# Patient Record
Sex: Female | Born: 1945 | Race: Black or African American | Hispanic: No | Smoking: Former smoker
Health system: Southern US, Community
[De-identification: ages and names within clinical notes are randomized; demographics above are authoritative.]

## PROBLEM LIST (undated history)

## (undated) DIAGNOSIS — I10 Essential (primary) hypertension: Secondary | ICD-10-CM

## (undated) DIAGNOSIS — M1990 Unspecified osteoarthritis, unspecified site: Secondary | ICD-10-CM

## (undated) DIAGNOSIS — K635 Polyp of colon: Secondary | ICD-10-CM

## (undated) DIAGNOSIS — N183 Chronic kidney disease, stage 3 unspecified: Secondary | ICD-10-CM

## (undated) DIAGNOSIS — J309 Allergic rhinitis, unspecified: Secondary | ICD-10-CM

## (undated) DIAGNOSIS — J449 Chronic obstructive pulmonary disease, unspecified: Secondary | ICD-10-CM

## (undated) DIAGNOSIS — I5030 Unspecified diastolic (congestive) heart failure: Secondary | ICD-10-CM

## (undated) DIAGNOSIS — I480 Paroxysmal atrial fibrillation: Secondary | ICD-10-CM

## (undated) DIAGNOSIS — J8410 Pulmonary fibrosis, unspecified: Secondary | ICD-10-CM

## (undated) DIAGNOSIS — I4891 Unspecified atrial fibrillation: Secondary | ICD-10-CM

## (undated) DIAGNOSIS — N189 Chronic kidney disease, unspecified: Secondary | ICD-10-CM

## (undated) HISTORY — DX: Paroxysmal atrial fibrillation: I48.0

## (undated) HISTORY — PX: BREAST CYST EXCISION: SHX579

## (undated) HISTORY — DX: Polyp of colon: K63.5

## (undated) HISTORY — DX: Essential (primary) hypertension: I10

## (undated) HISTORY — DX: Chronic kidney disease, stage 3 unspecified: N18.30

## (undated) HISTORY — DX: Unspecified diastolic (congestive) heart failure: I50.30

## (undated) HISTORY — DX: Allergic rhinitis, unspecified: J30.9

## (undated) HISTORY — PX: VAGINAL HYSTERECTOMY: SUR661

## (undated) HISTORY — DX: Chronic obstructive pulmonary disease, unspecified: J44.9

## (undated) HISTORY — DX: Unspecified atrial fibrillation: I48.91

## (undated) HISTORY — DX: Chronic kidney disease, stage 3 (moderate): N18.3

## (undated) HISTORY — DX: Unspecified osteoarthritis, unspecified site: M19.90

## (undated) HISTORY — PX: CATARACT EXTRACTION, BILATERAL: SHX1313

## (undated) HISTORY — DX: Pulmonary fibrosis, unspecified: J84.10

## (undated) HISTORY — DX: Chronic kidney disease, unspecified: N18.9

---

## 1963-03-23 HISTORY — PX: BREAST EXCISIONAL BIOPSY: SUR124

## 2004-08-03 ENCOUNTER — Ambulatory Visit: Payer: Self-pay | Admitting: Family Medicine

## 2004-12-06 ENCOUNTER — Other Ambulatory Visit: Payer: Self-pay

## 2004-12-06 ENCOUNTER — Emergency Department: Payer: Self-pay | Admitting: Emergency Medicine

## 2005-10-13 ENCOUNTER — Ambulatory Visit: Payer: Self-pay

## 2007-05-02 ENCOUNTER — Ambulatory Visit: Payer: Self-pay

## 2008-02-20 ENCOUNTER — Ambulatory Visit: Payer: Self-pay | Admitting: Nephrology

## 2008-06-17 ENCOUNTER — Ambulatory Visit: Payer: Self-pay | Admitting: Gastroenterology

## 2010-02-04 ENCOUNTER — Ambulatory Visit: Payer: Self-pay | Admitting: Internal Medicine

## 2011-05-25 ENCOUNTER — Ambulatory Visit: Payer: Self-pay | Admitting: Internal Medicine

## 2012-01-31 ENCOUNTER — Emergency Department: Payer: Self-pay | Admitting: Emergency Medicine

## 2012-01-31 LAB — CBC
MCH: 26.4 pg (ref 26.0–34.0)
MCV: 79 fL — ABNORMAL LOW (ref 80–100)
Platelet: 217 10*3/uL (ref 150–440)
RBC: 5.04 10*6/uL (ref 3.80–5.20)
RDW: 15.3 % — ABNORMAL HIGH (ref 11.5–14.5)
WBC: 7.8 10*3/uL (ref 3.6–11.0)

## 2012-01-31 LAB — COMPREHENSIVE METABOLIC PANEL
Albumin: 3.6 g/dL (ref 3.4–5.0)
Anion Gap: 8 (ref 7–16)
Bilirubin,Total: 0.8 mg/dL (ref 0.2–1.0)
Chloride: 107 mmol/L (ref 98–107)
Co2: 27 mmol/L (ref 21–32)
Creatinine: 1.36 mg/dL — ABNORMAL HIGH (ref 0.60–1.30)
EGFR (African American): 47 — ABNORMAL LOW
EGFR (Non-African Amer.): 40 — ABNORMAL LOW
Glucose: 93 mg/dL (ref 65–99)
Osmolality: 287 (ref 275–301)
Potassium: 3.4 mmol/L — ABNORMAL LOW (ref 3.5–5.1)
SGPT (ALT): 18 U/L (ref 12–78)
Sodium: 142 mmol/L (ref 136–145)

## 2012-01-31 LAB — CK TOTAL AND CKMB (NOT AT ARMC): CK, Total: 91 U/L (ref 21–215)

## 2012-01-31 LAB — TROPONIN I: Troponin-I: 0.04 ng/mL

## 2012-01-31 LAB — MAGNESIUM: Magnesium: 1.9 mg/dL

## 2012-01-31 LAB — PROTIME-INR: Prothrombin Time: 12.8 secs (ref 11.5–14.7)

## 2012-02-07 ENCOUNTER — Encounter: Payer: Self-pay | Admitting: *Deleted

## 2012-02-10 ENCOUNTER — Ambulatory Visit (INDEPENDENT_AMBULATORY_CARE_PROVIDER_SITE_OTHER): Payer: Medicare Other | Admitting: Cardiovascular Disease

## 2012-02-10 ENCOUNTER — Encounter: Payer: Self-pay | Admitting: Cardiovascular Disease

## 2012-02-10 VITALS — BP 122/80 | HR 56 | Ht 69.0 in | Wt 262.8 lb

## 2012-02-10 DIAGNOSIS — I4891 Unspecified atrial fibrillation: Secondary | ICD-10-CM

## 2012-02-10 DIAGNOSIS — I10 Essential (primary) hypertension: Secondary | ICD-10-CM | POA: Insufficient documentation

## 2012-02-10 DIAGNOSIS — R000 Tachycardia, unspecified: Secondary | ICD-10-CM

## 2012-02-10 DIAGNOSIS — I480 Paroxysmal atrial fibrillation: Secondary | ICD-10-CM | POA: Insufficient documentation

## 2012-02-10 NOTE — Assessment & Plan Note (Signed)
The patient had a first episode of atrial fibrillation recently. It was in the setting of sinusitis with use of decongestants which likely was the trigger. The patient has no other cardiac symptoms. She has no symptoms suggestive of sleep apnea which might occasionally trigger atrial fibrillation. I recommend checking thyroid function if that has not been done in the last few months. She has been stable on current dose of Toprol with no further palpitations. I will keep her on the same medication. I asked her to avoid decongestants. I discussed with her the natural history of atrial fibrillation. I will obtain an echocardiogram to evaluate LV systolic function, atrial size and valvular structure. CHADS VAS score is 3. thus, she is at moderate risk for thromboembolic complications. However, this was her first episode and it was likely triggered by decongestants. Thus, for now I recommend continuing aspirin daily. If the patient develops recurrent episodes, anticoagulation is recommended. I will have her followup with me in 6 months to reevaluate her symptoms. I asked her to notify us if she develops recurrent palpitations or tachycardia.

## 2012-02-10 NOTE — Assessment & Plan Note (Signed)
Her blood pressure is well controlled. 

## 2012-02-10 NOTE — Progress Notes (Signed)
HPI  This is a pleasant 66 year old African American female who was referred by Dr. Lawerance Bach for evaluation and management of newly diagnosed atrial fibrillation. The patient has history of hypertension and previous tobacco use. Recently, she had sinus infection. She took significant amount of decongestants over-the-counter. She was ultimately treated with Levaquin. During her last visit with Dr. Lawerance Bach on November 11, she was noted to be tachycardic. ECG showed atrial fibrillation with rapid ventricular response. The patient had no previous history of this. She continued to be tachycardic and thus she was sent to the emergency room at [facility]. Her labs there was unremarkable except for mild hypokalemia with a potassium of 3.4. Creatinine was 1.36 which is close to baseline. There were about to give her Cardizem but she converted spontaneously to normal sinus rhythm. She was placed on Toprol 25 mg once daily. No palpitations since then. She denies any chest pain or dyspnea. She denies dizziness, syncope or presyncope. She has no history of stroke, heart failure or diabetes. She does not have history of hyperthyroidism or sleep apnea.  Allergies  Allergen Reactions  . Penicillins      Current Outpatient Prescriptions on File Prior to Visit  Medication Sig Dispense Refill  . cetirizine (ZYRTEC) 10 MG tablet Take 10 mg by mouth daily.      . chlorpheniramine-HYDROcodone (TUSSIONEX) 10-8 MG/5ML LQCR Take 5 mLs by mouth as needed.      Marland Kitchen lisinopril (PRINIVIL,ZESTRIL) 20 MG tablet Take 20 mg by mouth daily.      . metoprolol succinate (TOPROL-XL) 25 MG 24 hr tablet Take 25 mg by mouth daily.      Marland Kitchen triamterene-hydrochlorothiazide (MAXZIDE) 75-50 MG per tablet Take 1 tablet by mouth daily.         Past Medical History  Diagnosis Date  . Hypertension   . Allergic rhinitis   . COPD (chronic obstructive pulmonary disease)   . A-fib   . CKD (chronic kidney disease)   . Osteoarthritis     right knee    . Colon polyps   . Pulmonary fibrosis      Past Surgical History  Procedure Date  . Vaginal hysterectomy   . Breast cyst excision   . Cataract extraction, bilateral      History reviewed. No pertinent family history.   History   Social History  . Marital Status: Divorced    Spouse Name: N/A    Number of Children: N/A  . Years of Education: N/A   Occupational History  . Not on file.   Social History Main Topics  . Smoking status: Former Smoker -- 1.0 packs/day for 30 years    Types: Cigarettes  . Smokeless tobacco: Not on file  . Alcohol Use: No  . Drug Use: No  . Sexually Active:    Other Topics Concern  . Not on file   Social History Narrative  . No narrative on file     ROS Constitutional: Negative for fever, chills, diaphoresis, activity change, appetite change and fatigue.  HENT: Negative for hearing loss, nosebleeds, congestion, sore throat, facial swelling, drooling, trouble swallowing, neck pain, voice change, sinus pressure and tinnitus.  Eyes: Negative for photophobia, pain, discharge and visual disturbance.  Respiratory: Negative for apnea, cough, chest tightness, shortness of breath and wheezing.  Cardiovascular: Negative for chest pain, palpitations and leg swelling.  Gastrointestinal: Negative for nausea, vomiting, abdominal pain, diarrhea, constipation, blood in stool and abdominal distention.  Genitourinary: Negative for dysuria, urgency, frequency,  hematuria and decreased urine volume.  Musculoskeletal: Negative for myalgias, back pain, joint swelling, arthralgias and gait problem.  Skin: Negative for color change, pallor, rash and wound.  Neurological: Negative for dizziness, tremors, seizures, syncope, speech difficulty, weakness, light-headedness, numbness and headaches.  Psychiatric/Behavioral: Negative for suicidal ideas, hallucinations, behavioral problems and agitation. The patient is not nervous/anxious.     PHYSICAL EXAM   BP  122/80  Pulse 56  Ht 5\' 9"  (1.753 m)  Wt 262 lb 12 oz (119.183 kg)  BMI 38.80 kg/m2 Constitutional: She is oriented to person, place, and time. She appears well-developed and well-nourished. No distress.  HENT: No nasal discharge.  Head: Normocephalic and atraumatic.  Eyes: Pupils are equal and round. Right eye exhibits no discharge. Left eye exhibits no discharge.  Neck: Normal range of motion. Neck supple. No JVD present. No thyromegaly present.  Cardiovascular: Normal rate, regular rhythm, normal heart sounds. Exam reveals no gallop and no friction rub. No murmur heard.  Pulmonary/Chest: Effort normal and breath sounds normal. No stridor. No respiratory distress. She has no wheezes. She has no rales. She exhibits no tenderness.  Abdominal: Soft. Bowel sounds are normal. She exhibits no distension. There is no tenderness. There is no rebound and no guarding.  Musculoskeletal: Normal range of motion. She exhibits no edema and no tenderness.  Neurological: She is alert and oriented to person, place, and time. Coordination normal.  Skin: Skin is warm and dry. No rash noted. She is not diaphoretic. No erythema. No pallor.  Psychiatric: She has a normal mood and affect. Her behavior is normal. Judgment and thought content normal.     EKG: Sinus  Bradycardia  -RSR(V1) -nondiagnostic.   PROBABLY NORMAL   ASSESSMENT AND PLAN

## 2012-02-10 NOTE — Patient Instructions (Addendum)
Your physician has requested that you have an echocardiogram. Echocardiography is a painless test that uses sound waves to create images of your heart. It provides your doctor with information about the size and shape of your heart and how well your heart's chambers and valves are working. This procedure takes approximately one hour. There are no restrictions for this procedure.  Follow up in 6 months.  

## 2012-03-14 ENCOUNTER — Other Ambulatory Visit (INDEPENDENT_AMBULATORY_CARE_PROVIDER_SITE_OTHER): Payer: Medicare Other

## 2012-03-14 ENCOUNTER — Other Ambulatory Visit: Payer: Self-pay

## 2012-03-14 DIAGNOSIS — I4891 Unspecified atrial fibrillation: Secondary | ICD-10-CM

## 2012-09-18 ENCOUNTER — Encounter: Payer: Self-pay | Admitting: Cardiovascular Disease

## 2012-09-18 ENCOUNTER — Ambulatory Visit (INDEPENDENT_AMBULATORY_CARE_PROVIDER_SITE_OTHER): Payer: Medicare Other | Admitting: Cardiovascular Disease

## 2012-09-18 VITALS — BP 130/60 | HR 62 | Ht 69.0 in | Wt 276.5 lb

## 2012-09-18 DIAGNOSIS — I480 Paroxysmal atrial fibrillation: Secondary | ICD-10-CM

## 2012-09-18 DIAGNOSIS — R0602 Shortness of breath: Secondary | ICD-10-CM

## 2012-09-18 DIAGNOSIS — I4891 Unspecified atrial fibrillation: Secondary | ICD-10-CM

## 2012-09-18 NOTE — Progress Notes (Signed)
HPI  This is a pleasant 67 year old [place] American female who is here today for a followup visit regarding atrial fibrillation. The patient has history of hypertension and previous tobacco use. In 01/2012,  she had sinus infection. She took significant amount of decongestants over-the-counter.  During her followup visit with Dr. Lawerance Bach on November 11, she was noted to be tachycardic. ECG showed atrial fibrillation with rapid ventricular response. She continued to be tachycardic and thus she was sent to the emergency room at [facility]. Her labs there was unremarkable except for mild hypokalemia with a potassium of 3.4. Creatinine was 1.36 which is close to baseline. There were about to give her Cardizem but she converted spontaneously to normal sinus rhythm. She was placed on Toprol 25 mg once daily. No palpitations since then. She denies any chest pain or dyspnea. She denies dizziness, syncope or presyncope. She has no history of stroke, heart failure or diabetes. She does not have history of hyperthyroidism or sleep apnea. She had an echocardiogram done in our office which showed normal LV systolic function, mild mitral regurgitation, normal pulmonary pressure and normal atrial size. She has been doing well with no recurrent cardiac symptoms.  Allergies  Allergen Reactions  . Penicillins      Current Outpatient Prescriptions on File Prior to Visit  Medication Sig Dispense Refill  . Acetaminophen (TYLENOL ARTHRITIS PAIN PO) Take by mouth as needed.      Marland Kitchen aspirin 81 MG tablet Take 81 mg by mouth daily.      . cetirizine (ZYRTEC) 10 MG tablet Take 10 mg by mouth daily.      . chlorpheniramine-HYDROcodone (TUSSIONEX) 10-8 MG/5ML LQCR Take 5 mLs by mouth as needed.      . metoprolol succinate (TOPROL-XL) 25 MG 24 hr tablet Take 25 mg by mouth daily.      Marland Kitchen triamterene-hydrochlorothiazide (MAXZIDE) 75-50 MG per tablet Take 1 tablet by mouth daily.       No current facility-administered medications  on file prior to visit.     Past Medical History  Diagnosis Date  . Allergic rhinitis   . COPD (chronic obstructive pulmonary disease)   . A-fib   . CKD (chronic kidney disease)   . Osteoarthritis     right knee  . Colon polyps   . Pulmonary fibrosis   . Hypertension      Past Surgical History  Procedure Laterality Date  . Vaginal hysterectomy    . Breast cyst excision    . Cataract extraction, bilateral       History reviewed. No pertinent family history.   History   Social History  . Marital Status: Divorced    Spouse Name: N/A    Number of Children: N/A  . Years of Education: N/A   Occupational History  . Not on file.   Social History Main Topics  . Smoking status: Former Smoker -- 1.00 packs/day for 30 years    Types: Cigarettes  . Smokeless tobacco: Not on file  . Alcohol Use: No  . Drug Use: No  . Sexually Active:    Other Topics Concern  . Not on file   Social History Narrative  . No narrative on file      PHYSICAL EXAM   BP 130/60  Pulse 62  Ht 5\' 9"  (1.753 m)  Wt 276 lb 8 oz (125.42 kg)  BMI 40.81 kg/m2 Constitutional: She is oriented to person, place, and time. She appears well-developed and well-nourished. No distress.  HENT: No nasal discharge.  Head: Normocephalic and atraumatic.  Eyes: Pupils are equal and round. Right eye exhibits no discharge. Left eye exhibits no discharge.  Neck: Normal range of motion. Neck supple. No JVD present. No thyromegaly present.  Cardiovascular: Normal rate, regular rhythm, normal heart sounds. Exam reveals no gallop and no friction rub. No murmur heard.  Pulmonary/Chest: Effort normal and breath sounds normal. No stridor. No respiratory distress. She has no wheezes. She has no rales. She exhibits no tenderness.  Abdominal: Soft. Bowel sounds are normal. She exhibits no distension. There is no tenderness. There is no rebound and no guarding.  Musculoskeletal: Normal range of motion. She exhibits no  edema and no tenderness.  Neurological: She is alert and oriented to person, place, and time. Coordination normal.  Skin: Skin is warm and dry. No rash noted. She is not diaphoretic. No erythema. No pallor.  Psychiatric: She has a normal mood and affect. Her behavior is normal. Judgment and thought content normal.     AVW:[id number]  Rhythm with sinus arrhythmia WITHIN NORMAL LIMITS    ASSESSMENT AND PLAN

## 2012-09-18 NOTE — Patient Instructions (Addendum)
Continue same medications  Follow up in 1 year

## 2012-09-18 NOTE — Assessment & Plan Note (Signed)
The patient had one episode of atrial fibrillation last year in the setting of sinusitis with use of decongestants which likely was the trigger. No recurrent episodes of symptoms since then.  CHADS VAS score is 3. thus, she is at moderate risk for thromboembolic complications. However, the episode of atrial fibrillation was likely triggered by decongestants. Thus, for now I recommend continuing aspirin daily. If the patient develops recurrent episodes, anticoagulation is recommended. Her echocardiogram was overall reassuring with normal atrial size and no evidence of significant structural abnormalities. Continue small dose metoprolol. I will have her followup with me on a yearly basis but I asked her to notify us if he develops any palpitations or other cardiac symptoms.

## 2012-10-10 ENCOUNTER — Ambulatory Visit: Payer: Self-pay | Admitting: Internal Medicine

## 2013-11-05 ENCOUNTER — Encounter: Payer: Self-pay | Admitting: Cardiovascular Disease

## 2013-11-05 ENCOUNTER — Ambulatory Visit (INDEPENDENT_AMBULATORY_CARE_PROVIDER_SITE_OTHER): Payer: Medicare Other | Admitting: Cardiovascular Disease

## 2013-11-05 VITALS — BP 118/78 | HR 56 | Ht 69.0 in | Wt 273.1 lb

## 2013-11-05 DIAGNOSIS — I10 Essential (primary) hypertension: Secondary | ICD-10-CM

## 2013-11-05 DIAGNOSIS — I480 Paroxysmal atrial fibrillation: Secondary | ICD-10-CM

## 2013-11-05 DIAGNOSIS — I4891 Unspecified atrial fibrillation: Secondary | ICD-10-CM

## 2013-11-05 NOTE — Assessment & Plan Note (Signed)
Blood pressure is well controlled on current medications. 

## 2013-11-05 NOTE — Progress Notes (Signed)
HPI  This is a pleasant 68 year old [place] American female who is here today for a followup visit regarding atrial fibrillation. The patient has history of hypertension and previous tobacco use. In 01/2012,  she had sinus infection. She took significant amount of decongestants over-the-counter. She was noted to be tachycardic. ECG showed atrial fibrillation with rapid ventricular response. She continued to be tachycardic and thus she was sent to the emergency room at [facility]. Her labs there was unremarkable except for mild hypokalemia with a potassium of 3.4. Creatinine was 1.36 which is close to baseline. There were about to give her Cardizem but she converted spontaneously to normal sinus rhythm. She was placed on Toprol 25 mg once daily. No palpitations since then. She denies any chest pain or dyspnea. She denies dizziness, syncope or presyncope. She has no history of stroke, heart failure or diabetes. She does not have history of hyperthyroidism or sleep apnea. She had an echocardiogram done in our office which showed normal LV systolic function, mild mitral regurgitation, normal pulmonary pressure and normal atrial size. She has been doing well with no recurrent cardiac symptoms.  Allergies  Allergen Reactions  . Penicillins   . Statins     Other reaction(s): Other (See Comments) myalgias     Current Outpatient Prescriptions on File Prior to Visit  Medication Sig Dispense Refill  . Acetaminophen (TYLENOL ARTHRITIS PAIN PO) Take by mouth as needed.      Marland Kitchen aspirin 81 MG tablet Take 81 mg by mouth daily.      . cetirizine (ZYRTEC) 10 MG tablet Take 10 mg by mouth daily.      . fluticasone (FLONASE) 50 MCG/ACT nasal spray Place 1 spray into the nose as needed.       Marland Kitchen losartan (COZAAR) 50 MG tablet Take 50 mg by mouth daily.       . metoprolol succinate (TOPROL-XL) 25 MG 24 hr tablet Take 25 mg by mouth daily.      Marland Kitchen triamterene-hydrochlorothiazide (MAXZIDE) 75-50 MG per tablet Take 1  tablet by mouth daily.       No current facility-administered medications on file prior to visit.     Past Medical History  Diagnosis Date  . Allergic rhinitis   . COPD (chronic obstructive pulmonary disease)   . A-fib   . CKD (chronic kidney disease)   . Osteoarthritis     right knee  . Colon polyps   . Pulmonary fibrosis   . Hypertension      Past Surgical History  Procedure Laterality Date  . Vaginal hysterectomy    . Breast cyst excision    . Cataract extraction, bilateral       History reviewed. No pertinent family history.   History   Social History  . Marital Status: Divorced    Spouse Name: N/A    Number of Children: N/A  . Years of Education: N/A   Occupational History  . Not on file.   Social History Main Topics  . Smoking status: Former Smoker -- 1.00 packs/day for 30 years    Types: Cigarettes  . Smokeless tobacco: Not on file  . Alcohol Use: No  . Drug Use: No  . Sexual Activity:    Other Topics Concern  . Not on file   Social History Narrative  . No narrative on file      PHYSICAL EXAM   BP 118/78  Pulse 56  Ht 5\' 9"  (1.753 m)  Wt 273 lb  2 oz (123.889 kg)  BMI 40.32 kg/m2 Constitutional: She is oriented to person, place, and time. She appears well-developed and well-nourished. No distress.  HENT: No nasal discharge.  Head: Normocephalic and atraumatic.  Eyes: Pupils are equal and round. Right eye exhibits no discharge. Left eye exhibits no discharge.  Neck: Normal range of motion. Neck supple. No JVD present. No thyromegaly present.  Cardiovascular: Normal rate, regular rhythm, normal heart sounds. Exam reveals no gallop and no friction rub. No murmur heard.  Pulmonary/Chest: Effort normal and breath sounds normal. No stridor. No respiratory distress. She has no wheezes. She has no rales. She exhibits no tenderness.  Abdominal: Soft. Bowel sounds are normal. She exhibits no distension. There is no tenderness. There is no rebound  and no guarding.  Musculoskeletal: Normal range of motion. She exhibits no edema and no tenderness.  Neurological: She is alert and oriented to person, place, and time. Coordination normal.  Skin: Skin is warm and dry. No rash noted. She is not diaphoretic. No erythema. No pallor.  Psychiatric: She has a normal mood and affect. Her behavior is normal. Judgment and thought content normal.     ZOX:[id number]:Sinus  Bradycardia  -Nonspecific QRS widening.   BORDERLINE  ASSESSMENT AND PLAN

## 2013-11-05 NOTE — Assessment & Plan Note (Signed)
She hasn't had any recurrent episodes of atrial fibrillation. Continue small dose Toprol and aspirin. Continue watchful waiting and recommend anticoagulation if she develops another episode of atrial fibrillation.

## 2013-11-05 NOTE — Patient Instructions (Signed)
Continue same medications.   Your physician wants you to follow-up in: 1 year.  You will receive a reminder letter in the mail two months in advance. If you don't receive a letter, please call our office to schedule the follow-up appointment.  

## 2013-11-06 ENCOUNTER — Ambulatory Visit: Payer: Self-pay | Admitting: Internal Medicine

## 2014-02-11 ENCOUNTER — Ambulatory Visit: Payer: Self-pay | Admitting: Gastroenterology

## 2014-07-15 LAB — SURGICAL PATHOLOGY

## 2015-01-23 ENCOUNTER — Telehealth: Payer: Self-pay | Admitting: Cardiovascular Disease

## 2015-01-23 NOTE — Telephone Encounter (Signed)
3 attempts to schedule from recall list lmov to schedule    Deleting recall.

## 2015-03-23 HISTORY — PX: EYE SURGERY: SHX253

## 2015-09-18 ENCOUNTER — Other Ambulatory Visit: Payer: Self-pay | Admitting: Internal Medicine

## 2015-09-18 DIAGNOSIS — Z1231 Encounter for screening mammogram for malignant neoplasm of breast: Secondary | ICD-10-CM

## 2015-09-26 ENCOUNTER — Emergency Department
Admission: EM | Admit: 2015-09-26 | Discharge: 2015-09-26 | Disposition: A | Discharge status: Home / Self Care | Payer: Medicare HMO | Attending: Emergency Medicine | Admitting: Emergency Medicine

## 2015-09-26 ENCOUNTER — Encounter: Payer: Self-pay | Admitting: Emergency Medicine

## 2015-09-26 ENCOUNTER — Emergency Department: Payer: Medicare HMO

## 2015-09-26 DIAGNOSIS — Z7951 Long term (current) use of inhaled steroids: Secondary | ICD-10-CM | POA: Diagnosis not present

## 2015-09-26 DIAGNOSIS — J449 Chronic obstructive pulmonary disease, unspecified: Secondary | ICD-10-CM | POA: Insufficient documentation

## 2015-09-26 DIAGNOSIS — Z86010 Personal history of colonic polyps: Secondary | ICD-10-CM | POA: Diagnosis not present

## 2015-09-26 DIAGNOSIS — Z87891 Personal history of nicotine dependence: Secondary | ICD-10-CM | POA: Insufficient documentation

## 2015-09-26 DIAGNOSIS — M25562 Pain in left knee: Secondary | ICD-10-CM | POA: Diagnosis present

## 2015-09-26 DIAGNOSIS — M1990 Unspecified osteoarthritis, unspecified site: Secondary | ICD-10-CM | POA: Diagnosis not present

## 2015-09-26 DIAGNOSIS — Z79899 Other long term (current) drug therapy: Secondary | ICD-10-CM | POA: Diagnosis not present

## 2015-09-26 DIAGNOSIS — I129 Hypertensive chronic kidney disease with stage 1 through stage 4 chronic kidney disease, or unspecified chronic kidney disease: Secondary | ICD-10-CM | POA: Diagnosis not present

## 2015-09-26 DIAGNOSIS — Z7982 Long term (current) use of aspirin: Secondary | ICD-10-CM | POA: Insufficient documentation

## 2015-09-26 DIAGNOSIS — N189 Chronic kidney disease, unspecified: Secondary | ICD-10-CM | POA: Insufficient documentation

## 2015-09-26 DIAGNOSIS — I480 Paroxysmal atrial fibrillation: Secondary | ICD-10-CM | POA: Diagnosis not present

## 2015-09-26 LAB — BASIC METABOLIC PANEL
ANION GAP: 6 (ref 5–15)
BUN: 33 mg/dL — AB (ref 6–20)
CHLORIDE: 106 mmol/L (ref 101–111)
CO2: 27 mmol/L (ref 22–32)
Calcium: 8.9 mg/dL (ref 8.9–10.3)
Creatinine, Ser: 1.75 mg/dL — ABNORMAL HIGH (ref 0.44–1.00)
GFR calc Af Amer: 33 mL/min — ABNORMAL LOW (ref >60)
GFR calc non Af Amer: 28 mL/min — ABNORMAL LOW (ref >60)
GLUCOSE: 115 mg/dL — AB (ref 65–99)
Potassium: 4.1 mmol/L (ref 3.5–5.1)
Sodium: 139 mmol/L (ref 135–145)

## 2015-09-26 MED ORDER — NAPROXEN 500 MG PO TABS
500.0000 mg | ORAL_TABLET | Freq: Once | ORAL | Status: AC
  Administered 2015-09-26: 500 mg via ORAL
  Filled 2015-09-26: qty 1

## 2015-09-26 MED ORDER — LIDOCAINE 4 % EX PTCH
1.0000 | MEDICATED_PATCH | Freq: Every day | CUTANEOUS | Status: DC
Start: 2015-09-26 — End: 2018-05-18

## 2015-09-26 MED ORDER — DICLOFENAC SODIUM 1 % TD GEL: 2.0000 g | Freq: Four times a day (QID) | TRANSDERMAL | Status: DC

## 2015-09-26 NOTE — ED Notes (Signed)
Pt reports she was sitting down and got up and started walking noticed pain to left leg behind her knee.  Pt states she does not have the pain when she is sitting, but when she is walking. Pt states her left knee usually bothers her sometimes but not behind her knee.

## 2015-09-26 NOTE — ED Notes (Signed)
See triage note states she developed pain to posterior left knee today states she was getting up and felt pain to knee  Denies any while sitting only with standing

## 2015-09-26 NOTE — ED Provider Notes (Signed)
[facility] Emergency Department Provider Note ____________________________________________  Time seen: Approximately 6:32 PM  I have reviewed the triage vital signs and the nursing notes.   HISTORY  Chief Complaint Leg Pain    HPI Holly Townsend is a 70 y.o. female who presents to the emergency department for evaluation of the left posterior knee pain. She states that pain started today after standing up out of her chair. Pain is not present when she is at rest, but immediately starts if she begins to walk. No increase in shortness of breath. No chest pain. She denies history of DVT. She does state that she has been having some cramping in her lower extremities and reports that some changes were made to her blood pressure medication that has made her urinate more frequently.  Past Medical History  Diagnosis Date  . Allergic rhinitis   . COPD (chronic obstructive pulmonary disease) (HCC)   . A-fib (HCC)   . CKD (chronic kidney disease)   . Osteoarthritis     right knee  . Colon polyps   . Pulmonary fibrosis (HCC)   . Hypertension     Patient Active Problem List   Diagnosis Date Noted  . Paroxysmal atrial fibrillation (HCC) 02/10/2012  . Hypertension     Past Surgical History  Procedure Laterality Date  . Vaginal hysterectomy    . Breast cyst excision    . Cataract extraction, bilateral      Current Outpatient Rx  Name  Route  Sig  Dispense  Refill  . Acetaminophen (TYLENOL ARTHRITIS PAIN PO)   Oral   Take by mouth as needed.         Marland Kitchen. aspirin 81 MG tablet   Oral   Take 81 mg by mouth daily.         . cetirizine (ZYRTEC) 10 MG tablet   Oral   Take 10 mg by mouth daily.         . Cholecalciferol (VITAMIN D PO)   Oral   Take 2 tablets by mouth daily.         . diclofenac sodium (VOLTAREN) 1 % GEL   Topical   Apply 2 g topically 4 (four) times daily.   1 Tube   0   . fluticasone (FLONASE) 50 MCG/ACT nasal spray    Nasal   Place 1 spray into the nose as needed.          . Lidocaine 4 % PTCH   Apply externally   Apply 1 patch topically daily.   15 patch   0   . losartan (COZAAR) 50 MG tablet   Oral   Take 50 mg by mouth daily.          . metoprolol succinate (TOPROL-XL) 25 MG 24 hr tablet   Oral   Take 25 mg by mouth daily.         Marland Kitchen. omeprazole (PRILOSEC) 20 MG capsule   Oral   Take 20 mg by mouth daily.         Marland Kitchen. triamterene-hydrochlorothiazide (MAXZIDE) 75-50 MG per tablet   Oral   Take 1 tablet by mouth daily.           Allergies Penicillins and Statins  History reviewed. No pertinent family history.  Social History Social History  Substance Use Topics  . Smoking status: Former Smoker -- 1.00 packs/day for 30 years    Types: Cigarettes  . Smokeless tobacco: None  . Alcohol Use: No  Review of Systems Constitutional: No recent illness. Cardiovascular: Denies chest pain or palpitations. Respiratory: Denies shortness of breath. Musculoskeletal: Pain in left knee Skin: Negative for rash, wound, lesion. Neurological: Negative for focal weakness or numbness.  ____________________________________________   PHYSICAL EXAM:  VITAL SIGNS: ED Triage Vitals  Enc Vitals Group     BP 09/26/15 1800 159/86 mmHg     Pulse Rate 09/26/15 1800 55     Resp 09/26/15 1800 18     Temp 09/26/15 1800 98.2 F (36.8 C)     Temp Source 09/26/15 1800 Oral     SpO2 09/26/15 1800 98 %     Weight 09/26/15 1800 275 lb (124.739 kg)     Height 09/26/15 1800 5\' 9"  (1.753 m)     Head Cir --      Peak Flow --      Pain Score 09/26/15 1800 9     Pain Loc --      Pain Edu? --      Excl. in GC? --     Constitutional: Alert and oriented. Well appearing and in no acute distress. Eyes: Conjunctivae are normal. EOMI. Head: Atraumatic. Neck: No stridor.  Respiratory: Normal respiratory effort.   Musculoskeletal: Tenderness to palpation in the popliteal fossa area. Homans's sign is  negative. Neurologic:  Normal speech and language. No gross focal neurologic deficits are appreciated. Speech is normal. No gait instability. Skin:  Skin is warm, dry and intact. Atraumatic. Psychiatric: Mood and affect are normal. Speech and behavior are normal.  ____________________________________________   LABS (all labs ordered are listed, but only abnormal results are displayed)  Labs Reviewed  BASIC METABOLIC PANEL - Abnormal; Notable for the following:    Glucose, Bld 115 (*)    BUN 33 (*)    Creatinine, Ser 1.75 (*)    GFR calc non Af Amer 28 (*)    GFR calc Af Amer 33 (*)    All other components within normal limits   ____________________________________________  RADIOLOGY  No evidence of left lower extremity deep venous thrombosis. Right common femoral vein also patent.  ____________________________________________   PROCEDURES  Procedure(s) performed:  Knee immobilizer applied to the left knee by ER tech. Patient was neurovascularly intact post-application.   ____________________________________________   INITIAL IMPRESSION / ASSESSMENT AND PLAN / ED COURSE  Pertinent labs & imaging results that were available during my care of the patient were reviewed by me and considered in my medical decision making (see chart for details).  Patient was given prescriptions for Voltaren gel and lidocaine patches. She is to call and schedule follow-up appointment with orthopedics. She will wear her knee immobilizer while out of bed. She is to return to the emergency room if her symptoms that change or worsen if unable to schedule an appointment with primary care or orthopedics. ____________________________________________   FINAL CLINICAL IMPRESSION(S) / ED DIAGNOSES  Final diagnoses:  Knee pain, acute, left       Chinita PesterCari B Cherrish Vitali, FNP 09/26/15 [id number]  Jennye MoccasinBrian S Quigley, MD 09/26/15 [phone] [id number]

## 2015-09-26 NOTE — Discharge Instructions (Signed)
Joint Pain Joint pain, which is also called arthralgia, can be caused by many things. Joint pain often goes away when you follow your health care provider's instructions for relieving pain at home. However, joint pain can also be caused by conditions that require further treatment. Common causes of joint pain include:  Bruising in the area of the joint.  Overuse of the joint.  Wear and tear on the joints that occur with aging (osteoarthritis).  Various other forms of arthritis.  A buildup of a crystal form of uric acid in the joint (gout).  Infections of the joint (septic arthritis) or of the bone (osteomyelitis). Your health care provider may recommend medicine to help with the pain. If your joint pain continues, additional tests may be needed to diagnose your condition. HOME CARE INSTRUCTIONS Watch your condition for any changes. Follow these instructions as directed to lessen the pain that you are feeling.  Take medicines only as directed by your health care provider.  Rest the affected area for as long as your health care provider says that you should. If directed to do so, raise the painful joint above the level of your heart while you are sitting or lying down.  Do not do things that cause or worsen pain.  If directed, apply ice to the painful area:  Put ice in a plastic bag.  Place a towel between your skin and the bag.  Leave the ice on for 20 minutes, 2-3 times per day.  Wear an elastic bandage, splint, or sling as directed by your health care provider. Loosen the elastic bandage or splint if your fingers or toes become numb and tingle, or if they turn cold and blue.  Begin exercising or stretching the affected area as directed by your health care provider. Ask your health care provider what types of exercise are safe for you.  Keep all follow-up visits as directed by your health care provider. This is important. SEEK MEDICAL CARE IF:  Your pain increases, and medicine  does not help.  Your joint pain does not improve within 3 days.  You have increased bruising or swelling.  You have a fever.  You lose 10 lb (4.5 kg) or more without trying. SEEK IMMEDIATE MEDICAL CARE IF:  You are not able to move the joint.  Your fingers or toes become numb or they turn cold and blue.   This information is not intended to replace advice given to you by your health care provider. Make sure you discuss any questions you have with your health care provider.   Document Released: 03/08/2005 Document Revised: 03/29/2014 Document Reviewed: 12/18/2013 Elsevier Interactive Patient Education 2016 [employer]  How to Use a Knee Brace A knee brace is a device that you wear to support your knee, especially if the knee is healing after an injury or surgery. There are several types of knee braces. Some are designed to prevent an injury (prophylactic brace). These are often worn during sports. Others support an injured knee (functional brace) or keep it still while it heals (rehabilitative brace). People with severe arthritis of the knee may benefit from a brace that takes some pressure off the knee (unloader brace). Most knee braces are made from a combination of cloth and metal or plastic.  You may need to wear a knee brace to:  Relieve knee pain.  Help your knee support your weight (improve stability).  Help you walk farther (improve mobility).  Prevent injury.  Support your knee while  it heals from surgery or from an injury. RISKS AND COMPLICATIONS Generally, knee braces are very safe to wear. However, problems may occur, including:  Skin irritation that may lead to infection.  Making your condition worse if you wear the brace in the wrong way. HOW TO USE A KNEE BRACE Different braces will have different instructions for use. Your health care provider will tell you or show you:  How to put on your brace.  How to adjust the brace.  When and how often to wear the  brace.  How to remove the brace.  If you will need any assistive devices in addition to the brace, such as crutches or a cane. In general, your brace should:  Have the hinge of the brace line up with the bend of your knee.  Have straps, hooks, or tapes that fasten snugly around your leg.  Not feel too tight or too loose. HOW TO CARE FOR A KNEE BRACE  Check your brace often for signs of damage, such as loose connections or attachments. Your knee brace may get damaged or wear out during normal use.  Wash the fabric parts of your brace with soap and water.  Read the insert that comes with your brace for other specific care instructions. SEEK MEDICAL CARE IF:  Your knee brace is too loose or too tight and you cannot adjust it.  Your knee brace causes skin redness, swelling, bruising, or irritation.  Your knee brace is not helping.  Your knee brace is making your knee pain worse.   This information is not intended to replace advice given to you by your health care provider. Make sure you discuss any questions you have with your health care provider.   Document Released: 05/29/2003 Document Revised: 11/27/2014 Document Reviewed: 07/01/2014 Elsevier Interactive Patient Education [employer] Elsevier Inc.

## 2015-10-07 ENCOUNTER — Ambulatory Visit
Admission: RE | Admit: 2015-10-07 | Discharge: 2015-10-07 | Disposition: A | Discharge status: Home / Self Care | Payer: Medicare HMO | Source: Ambulatory Visit | Attending: Internal Medicine | Admitting: Internal Medicine

## 2015-10-07 ENCOUNTER — Other Ambulatory Visit: Payer: Self-pay | Admitting: Internal Medicine

## 2015-10-07 DIAGNOSIS — Z1231 Encounter for screening mammogram for malignant neoplasm of breast: Secondary | ICD-10-CM | POA: Diagnosis present

## 2015-10-29 ENCOUNTER — Ambulatory Visit: Payer: Medicare HMO | Attending: Internal Medicine

## 2015-10-29 DIAGNOSIS — R351 Nocturia: Secondary | ICD-10-CM | POA: Diagnosis present

## 2015-10-29 DIAGNOSIS — G4733 Obstructive sleep apnea (adult) (pediatric): Secondary | ICD-10-CM | POA: Diagnosis not present

## 2016-02-20 ENCOUNTER — Ambulatory Visit: Payer: Medicare HMO | Attending: Internal Medicine

## 2016-02-20 DIAGNOSIS — G4733 Obstructive sleep apnea (adult) (pediatric): Secondary | ICD-10-CM | POA: Insufficient documentation

## 2016-09-09 ENCOUNTER — Other Ambulatory Visit: Payer: Self-pay | Admitting: Internal Medicine

## 2016-09-09 DIAGNOSIS — Z1231 Encounter for screening mammogram for malignant neoplasm of breast: Secondary | ICD-10-CM

## 2016-10-07 ENCOUNTER — Ambulatory Visit
Admission: RE | Admit: 2016-10-07 | Discharge: 2016-10-07 | Disposition: A | Discharge status: Home / Self Care | Payer: Medicare HMO | Source: Ambulatory Visit | Attending: Internal Medicine | Admitting: Internal Medicine

## 2016-10-07 DIAGNOSIS — Z1231 Encounter for screening mammogram for malignant neoplasm of breast: Secondary | ICD-10-CM | POA: Diagnosis present

## 2017-08-02 ENCOUNTER — Telehealth: Payer: Self-pay | Admitting: *Deleted

## 2017-08-02 NOTE — Telephone Encounter (Signed)
Received referral for low dose lung cancer screening CT scan. Message left at phone number listed in EMR for patient to call me back to facilitate scheduling scan.  

## 2017-08-10 ENCOUNTER — Telehealth: Payer: Self-pay | Admitting: *Deleted

## 2017-08-10 NOTE — Telephone Encounter (Signed)
Received referral for low dose lung cancer screening CT scan. Message left at phone number listed in EMR for patient to call me back to facilitate scheduling scan.  

## 2017-08-11 ENCOUNTER — Telehealth: Payer: Self-pay | Admitting: *Deleted

## 2017-08-11 NOTE — Telephone Encounter (Signed)
Received referral for initial lung cancer screening scan. 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

## 2017-09-01 ENCOUNTER — Other Ambulatory Visit: Payer: Self-pay | Admitting: Internal Medicine

## 2017-09-01 DIAGNOSIS — Z1231 Encounter for screening mammogram for malignant neoplasm of breast: Secondary | ICD-10-CM

## 2017-09-13 ENCOUNTER — Telehealth: Payer: Self-pay | Admitting: *Deleted

## 2017-09-13 DIAGNOSIS — Z122 Encounter for screening for malignant neoplasm of respiratory organs: Secondary | ICD-10-CM

## 2017-09-13 DIAGNOSIS — Z87891 Personal history of nicotine dependence: Secondary | ICD-10-CM

## 2017-09-13 NOTE — Telephone Encounter (Signed)
Received referral for initial lung cancer screening scan. Contacted patient and obtained smoking history,(current, 70.5 pack year) as well as answering questions related to screening process. Patient denies signs of lung cancer such as weight loss or hemoptysis. Patient denies comorbidity that would prevent curative treatment if lung cancer were found. Patient is scheduled for shared decision making visit and CT scan on 10/04/17.

## 2017-10-04 ENCOUNTER — Ambulatory Visit
Admission: RE | Admit: 2017-10-04 | Discharge: 2017-10-04 | Disposition: A | Discharge status: Home / Self Care | Payer: Medicare HMO | Source: Ambulatory Visit | Attending: Oncology | Admitting: Oncology

## 2017-10-04 ENCOUNTER — Inpatient Hospital Stay: Payer: Medicare HMO | Attending: Oncology | Admitting: Oncology

## 2017-10-04 ENCOUNTER — Encounter: Payer: Self-pay | Admitting: Oncology

## 2017-10-04 DIAGNOSIS — Z87891 Personal history of nicotine dependence: Secondary | ICD-10-CM | POA: Diagnosis present

## 2017-10-04 DIAGNOSIS — Z122 Encounter for screening for malignant neoplasm of respiratory organs: Secondary | ICD-10-CM | POA: Diagnosis not present

## 2017-10-04 DIAGNOSIS — J439 Emphysema, unspecified: Secondary | ICD-10-CM | POA: Diagnosis not present

## 2017-10-04 DIAGNOSIS — E041 Nontoxic single thyroid nodule: Secondary | ICD-10-CM | POA: Insufficient documentation

## 2017-10-04 NOTE — Progress Notes (Signed)
In accordance with CMS guidelines, patient has met eligibility criteria including age, absence of signs or symptoms of lung cancer.  Social History   Tobacco Use  . Smoking status: Former Smoker    Packs/day: 1.50    Years: 47.00    Pack years: 70.50    Types: Cigarettes    Last attempt to quit: 2009    Years since quitting: 10.5  Substance Use Topics  . Alcohol use: No  . Drug use: No     A shared decision-making session was conducted prior to the performance of CT scan. This includes one or more decision aids, includes benefits and harms of screening, follow-up diagnostic testing, over-diagnosis, false positive rate, and total radiation exposure.  Counseling on the importance of adherence to annual lung cancer LDCT screening, impact of co-morbidities, and ability or willingness to undergo diagnosis and treatment is imperative for compliance of the program.  Counseling on the importance of continued smoking cessation for former smokers; the importance of smoking cessation for current smokers, and information about tobacco cessation interventions have been given to patient including [place] [facility] and [facility] quit [place] programs.  Written order for lung cancer screening with LDCT has been given to the patient and any and all questions have been answered to the best of my abilities.   Yearly follow up will be coordinated by Shawn Perkins, Thoracic Navigator.  Jennifer Burns, NP 10/04/2017 12:20 PM  

## 2017-10-06 ENCOUNTER — Telehealth: Payer: Self-pay | Admitting: *Deleted

## 2017-10-06 NOTE — Telephone Encounter (Signed)
PCP office notified of incidental thyroid nodule and recommended follow up. Message for Dr. Lawerance BachBurns left with office manager. Report faxed as well.

## 2017-10-06 NOTE — Telephone Encounter (Signed)
Notified patient of LDCT lung cancer screening program results with recommendation for 12 month follow up imaging. Also notified of incidental findings noted below and is encouraged to discuss further with PCP who will receive a copy of this note and/or the CT report. Patient verbalizes understanding.   IMPRESSION: 1. Lung-RADS 2, benign appearance or behavior. Continue annual screening with low-dose chest CT without contrast in 12 months. 2. Low-attenuation left thyroid nodule. Consider further evaluation with thyroid ultrasound. If patient is clinically hyperthyroid, consider nuclear medicine thyroid uptake and scan. 3.  Emphysema (ICD10-J43.9).

## 2017-10-10 ENCOUNTER — Ambulatory Visit
Admission: RE | Admit: 2017-10-10 | Discharge: 2017-10-10 | Disposition: A | Discharge status: Home / Self Care | Payer: Medicare HMO | Source: Ambulatory Visit | Attending: Internal Medicine | Admitting: Internal Medicine

## 2017-10-10 DIAGNOSIS — Z1231 Encounter for screening mammogram for malignant neoplasm of breast: Secondary | ICD-10-CM

## 2017-10-13 ENCOUNTER — Other Ambulatory Visit: Payer: Self-pay | Admitting: Internal Medicine

## 2017-10-13 DIAGNOSIS — E041 Nontoxic single thyroid nodule: Secondary | ICD-10-CM

## 2017-10-18 ENCOUNTER — Ambulatory Visit
Admission: RE | Admit: 2017-10-18 | Discharge: 2017-10-18 | Disposition: A | Discharge status: Home / Self Care | Payer: Medicare HMO | Source: Ambulatory Visit | Attending: Internal Medicine | Admitting: Internal Medicine

## 2017-10-18 DIAGNOSIS — E041 Nontoxic single thyroid nodule: Secondary | ICD-10-CM | POA: Diagnosis present

## 2017-10-18 DIAGNOSIS — E010 Iodine-deficiency related diffuse (endemic) goiter: Secondary | ICD-10-CM | POA: Diagnosis not present

## 2018-01-28 LAB — UNMAPPED LAB RESULTS

## 2018-04-05 ENCOUNTER — Ambulatory Visit: Payer: Medicare HMO | Admitting: Internal Medicine

## 2018-05-18 ENCOUNTER — Encounter

## 2018-05-18 ENCOUNTER — Encounter: Payer: Self-pay | Admitting: Cardiovascular Disease

## 2018-05-18 ENCOUNTER — Ambulatory Visit: Payer: Medicare HMO | Admitting: Cardiovascular Disease

## 2018-05-18 VITALS — BP 140/82 | HR 63 | Ht 69.0 in | Wt 293.2 lb

## 2018-05-18 DIAGNOSIS — R079 Chest pain, unspecified: Secondary | ICD-10-CM | POA: Diagnosis not present

## 2018-05-18 DIAGNOSIS — R0602 Shortness of breath: Secondary | ICD-10-CM | POA: Diagnosis not present

## 2018-05-18 DIAGNOSIS — I10 Essential (primary) hypertension: Secondary | ICD-10-CM

## 2018-05-18 NOTE — Progress Notes (Signed)
Cardiology Office Note   Date:  05/18/2018   ID:  Holly Townsend, DOB Jul 28, 1945, MRN [id number]  PCP:  Hyman Hopes, MD  Cardiologist:   Lorine Bears, MD   Chief Complaint  Patient presents with  . other    Pt. c/o LE edema, shortness of breath, has a cough & congestion and heartburn. Patient was seen in 2015 with Dr. Kirke Corin for irreg. heart beats. Meds reviewed by the pt. verbally.       History of Present Illness: Holly Townsend is a 73 y.o. female who was referred by Dr. Lawerance Bach for worsening leg edema and shortness of breath. She was seen by me in the past most recently in 2015.  She has known history of paroxysmal atrial fibrillation, stage III chronic kidney disease, essential hypertension and previous tobacco use. She was diagnosed with atrial fibrillation in 2013 in the setting of use of decongestants.  She converted spontaneously to sinus rhythm. Echocardiogram at that time showed normal LV systolic function, mild mitral regurgitation, normal pulmonary pressure and normal atrial size.  Given that her atrial fibrillation was brief at that time, she was not anticoagulated. Over the last 6 months, she has experienced worsening leg edema especially at the end of the day.  This has been associated with significant exertional dyspnea.  Over the last 2 weeks, she experienced chest discomfort described as heartburn.  This typically happens after she eats.  She takes omeprazole on a regular basis.  She also has been experiencing cough mostly at night.  She had was diagnosed with sleep apnea and tried using the CPAP but could not tolerate it due to worsening cough. She reports gaining about 10 pounds lately. She had a CT scan for lung cancer screening in 2019 which showed no evidence of coronary atherosclerosis.  However, it did show evidence of cardiomegaly and enlarged pulmonary artery.  There was evidence of emphysema on CT scan. The patient takes triamterene  hydrochlorothiazide and she reports that she was placed on another diuretic recently but I do not have a record of that.    Past Medical History:  Diagnosis Date  . A-fib (HCC)   . Allergic rhinitis   . CKD (chronic kidney disease)   . Colon polyps   . COPD (chronic obstructive pulmonary disease) (HCC)   . Hypertension   . Osteoarthritis    right knee  . Pulmonary fibrosis (HCC)     Past Surgical History:  Procedure Laterality Date  . BREAST CYST EXCISION    . BREAST EXCISIONAL BIOPSY Left 1965   NEG  . CATARACT EXTRACTION, BILATERAL    . VAGINAL HYSTERECTOMY       Current Outpatient Medications  Medication Sig Dispense Refill  . Acetaminophen (TYLENOL ARTHRITIS PAIN PO) Take by mouth as needed.    Marland Kitchen aspirin 81 MG tablet Take 81 mg by mouth daily.    . cetirizine (ZYRTEC) 10 MG tablet Take 10 mg by mouth daily.    . Cholecalciferol (VITAMIN D PO) Take 2 tablets by mouth daily.    . fluticasone (FLONASE) 50 MCG/ACT nasal spray Place 1 spray into the nose as needed.     Marland Kitchen losartan (COZAAR) 50 MG tablet Take 50 mg by mouth daily.     . metoprolol succinate (TOPROL-XL) 25 MG 24 hr tablet Take 25 mg by mouth daily.    Marland Kitchen omeprazole (PRILOSEC) 20 MG capsule Take 20 mg by mouth daily.    Marland Kitchen triamterene-hydrochlorothiazide (MAXZIDE) 75-50  MG per tablet Take 1 tablet by mouth daily.     No current facility-administered medications for this visit.     Allergies:   Penicillins; Statins; Pravastatin sodium; and Sulfamethoxazole-trimethoprim    Social History:  The patient  reports that she quit smoking about 11 years ago. Her smoking use included cigarettes. She has a 70.50 pack-year smoking history. She has never used smokeless tobacco. She reports that she does not drink alcohol or use drugs.   Family History:  The patient's family history includes Kidney failure in her father; Liver cancer in her mother.    ROS:  Please see the history of present illness.   Otherwise, review of  systems are positive for none.   All other systems are reviewed and negative.    PHYSICAL EXAM: VS:  BP 140/82 (BP Location: Right Arm, Patient Position: Sitting, Cuff Size: Large)   Pulse 63   Ht 5\' 9"  (1.753 m)   Wt 293 lb 4 oz (133 kg)   SpO2 95%   BMI 43.31 kg/m  , BMI Body mass index is 43.31 kg/m. GEN: Well nourished, well developed, in no acute distress  HEENT: normal  Neck: Jugular venous pressure is not well visualized, carotid bruits, or masses Cardiac: RRR; no murmurs, rubs, or gallops, mild to moderate symmetrical bilateral leg edema Respiratory:  clear to auscultation bilaterally, normal work of breathing GI: soft, nontender, nondistended, + BS MS: no deformity or atrophy  Skin: warm and dry, no rash Neuro:  Strength and sensation are intact Psych: euthymic mood, full affect   EKG:  EKG is ordered today. The ekg ordered today demonstrates sinus rhythm with PACs   Recent Labs: No results found for requested labs within last 8760 hours.    Lipid Panel No results found for: CHOL, TRIG, HDL, CHOLHDL, VLDL, LDLCALC, LDLDIRECT    Wt Readings from Last 3 Encounters:  05/18/18 293 lb 4 oz (133 kg)  10/04/17 285 lb (129.3 kg)  09/26/15 275 lb (124.7 kg)       PAD Screen 05/18/2018  Previous PAD dx? No  Previous surgical procedure? No  Pain with walking? No  Feet/toe relief with dangling? No  Painful, non-healing ulcers? No  Extremities discolored? No      ASSESSMENT AND PLAN:  1.  Exertional dyspnea and worsening leg edema: This has been associated with recent weight gain.  She appears to be volume overloaded by physical exam. I suspect that she has at least significant degree of diastolic heart failure given her prolonged history of hypertension, chronic kidney disease and sleep apnea. I requested an echocardiogram for evaluation. I asked her to bring me the name of the other diuretic she is taking as I suspect we will have to adjust the dose or switch  to torsemide.  Will likely discontinue triamterene hydrochlorothiazide to avoid being on multiple diuretics especially that she has underlying chronic kidney disease.  2.  Chest pain: Described as burning sensation: Could be due to GERD but she has multiple risk factors for coronary artery disease and she has experienced gradual worsening of exertional dyspnea which is currently happening with minimal activities.  Thus, I requested a Lexiscan Myoview.  3.  Essential hypertension: Blood pressure is mildly elevated today.  4.  Chronic kidney disease: Followed by [facility] nephrology.    Disposition:   FU with me in 1 month  Signed,  Lorine Bears, MD  05/18/2018 9:53 AM    [facility]

## 2018-05-18 NOTE — Patient Instructions (Signed)
Medication Instructions:  No changes If you need a refill on your cardiac medications before your next appointment, please call your pharmacy.   Lab work: None ordered  Testing/Procedures: Your physician has requested that you have an echocardiogram. Echocardiography is a painless test that uses sound waves to create images of your heart. It provides your doctor with information about the size and shape of your heart and how well your heart's chambers and valves are working. You may receive an ultrasound enhancing agent through an IV if needed to better visualize your heart during the echo.This procedure takes approximately one hour. There are no restrictions for this procedure. This will take place at the [facility] clinic.   Your physician has requested that you have a lexiscan myoview. For further information please visit [email or web address] Please follow instruction sheet, as given.  Follow-Up: At [facility], you and your health needs are our priority.  As part of our continuing mission to provide you with exceptional heart care, we have created designated Provider Care Teams.  These Care Teams include your primary Cardiologist (physician) and Advanced Practice Providers (APPs -  Physician Assistants and Nurse Practitioners) who all work together to provide you with the care you need, when you need it. You will need a follow up appointment after the echo and the myoview. You may see Dr. Kirke Corin or one of the following Advanced Practice Providers on your designated Care Team:   Nicolasa Ducking, NP Eula Listen, PA-C . Marisue Ivan, PA-C  Any Other Special Instructions Will Be Listed Below (If Applicable).  [facility] MYOVIEW  Your provider has ordered a Stress Test with nuclear imaging. The purpose of this test is to evaluate the blood supply to your heart muscle. This procedure is referred to as a "Non-Invasive Stress Test." This is because other than having an IV started in your  vein, nothing is inserted or "invades" your body. Cardiac stress tests are done to find areas of poor blood flow to the heart by determining the extent of coronary artery disease (CAD). Some patients exercise on a treadmill, which naturally increases the blood flow to your heart, while others who are unable to walk on a treadmill due to physical limitations have a pharmacologic/chemical stress agent called Lexiscan . This medicine will mimic walking on a treadmill by temporarily increasing your coronary blood flow.   Please note: these test may take anywhere between 2-4 hours to complete  PLEASE REPORT TO [facility]  THE VOLUNTEERS AT THE FIRST DESK WILL DIRECT YOU WHERE TO GO  Date of Procedure:_____________________________________  Arrival Time for Procedure:______________________________  Instructions regarding medication:  None to hold  PLEASE NOTIFY THE OFFICE AT LEAST 24 HOURS IN ADVANCE IF YOU ARE UNABLE TO KEEP YOUR APPOINTMENT.  [phone] AND  PLEASE NOTIFY NUCLEAR MEDICINE AT [facility] AT LEAST 24 HOURS IN ADVANCE IF YOU ARE UNABLE TO KEEP YOUR APPOINTMENT. [phone]  How to prepare for your Myoview test:  1. Do not eat or drink after midnight 2. No caffeine for 24 hours prior to test 3. No smoking 24 hours prior to test. 4. Your medication may be taken with water.  If your doctor stopped a medication because of this test, do not take that medication. 5. Ladies, please do not wear dresses.  Skirts or pants are appropriate. Please wear a short sleeve shirt. 6. No perfume, cologne or lotion. 7. Wear comfortable walking shoes. No heels!

## 2018-05-23 ENCOUNTER — Ambulatory Visit
Admission: RE | Admit: 2018-05-23 | Discharge: 2018-05-23 | Disposition: A | Discharge status: Home / Self Care | Payer: Medicare HMO | Source: Ambulatory Visit | Attending: Cardiovascular Disease | Admitting: Cardiovascular Disease

## 2018-05-23 DIAGNOSIS — R079 Chest pain, unspecified: Secondary | ICD-10-CM | POA: Diagnosis present

## 2018-05-23 LAB — NM MYOCAR MULTI W/SPECT W/WALL MOTION / EF
CSEPHR: 55 %
LVDIAVOL: 67 mL (ref 46–106)
LVSYSVOL: 31 mL
NUC STRESS TID: 0.79
Peak HR: 81 {beats}/min
Rest HR: 68 {beats}/min

## 2018-05-23 MED ORDER — REGADENOSON 0.4 MG/5ML IV SOLN
0.4000 mg | Freq: Once | INTRAVENOUS | Status: AC
  Administered 2018-05-23: 0.4 mg via INTRAVENOUS

## 2018-05-23 MED ORDER — TECHNETIUM TC 99M TETROFOSMIN IV KIT
30.0000 | PACK | Freq: Once | INTRAVENOUS | Status: AC | PRN
  Administered 2018-05-23: 32.251 via INTRAVENOUS

## 2018-05-23 MED ORDER — TECHNETIUM TC 99M TETROFOSMIN IV KIT
10.9600 | PACK | Freq: Once | INTRAVENOUS | Status: AC | PRN
  Administered 2018-05-23: 10.96 via INTRAVENOUS

## 2018-05-24 ENCOUNTER — Telehealth: Payer: Self-pay | Admitting: *Deleted

## 2018-05-24 NOTE — Telephone Encounter (Signed)
-----   Message from Iran Ouch, MD sent at 05/23/2018  6:05 PM EST ----- Inform patient that  stress test was normal.  Send a copy to primary care physician.

## 2018-05-24 NOTE — Telephone Encounter (Signed)
Patient made aware of results and verbalized understanding.  

## 2018-05-24 NOTE — Telephone Encounter (Signed)
Left a message for the patient to call back.  

## 2018-06-14 ENCOUNTER — Telehealth: Payer: Self-pay

## 2018-06-14 NOTE — Telephone Encounter (Signed)
Spoke with patient and she is agreeable to an telephone visit. The consent was read to her and she verbally agreed.  She is scheduled for an Echo on Fri. 06/16/2018 and was advised someone would review this appointment and be in touch prior to the appointment to keep this appointment or to reschedule.  All other questions answered and she was agreeable with this telephone visit.

## 2018-06-15 ENCOUNTER — Other Ambulatory Visit: Payer: Medicare HMO

## 2018-06-16 ENCOUNTER — Other Ambulatory Visit: Payer: Self-pay

## 2018-06-16 ENCOUNTER — Ambulatory Visit (INDEPENDENT_AMBULATORY_CARE_PROVIDER_SITE_OTHER): Payer: Medicare HMO

## 2018-06-16 DIAGNOSIS — R0602 Shortness of breath: Secondary | ICD-10-CM

## 2018-06-16 NOTE — Progress Notes (Signed)
Virtual Visit via Telephone Note    Evaluation Performed:  Follow-up visit  This visit type was conducted due to national recommendations for restrictions regarding the COVID-19 Pandemic (e.g. social distancing).  This format is felt to be most appropriate for this patient at this time.  All issues noted in this document were discussed and addressed.  No physical exam was performed (except for noted visual exam findings with Video Visits).  Please refer to the patient's chart (MyChart message for video visits and phone note for telephone visits) for the patient's consent to telehealth for [facility].  Date:  06/20/2018   ID:  Holly Townsend, DOB 1946/02/26, MRN [id number]  Patient Location:  [address] [place] [address]   Provider location:   Home  PCP:  Hyman Hopes, MD  Cardiologist:  Lorine Bears, MD  Electrophysiologist:  None   Chief Complaint:  Telehealth follow up  History of Present Illness:    Holly Townsend is a 73 y.o. female who presents via audio/video conferencing for a telehealth visit today.  She has a history of pulmonary hypertension, HFpEF, PAF not on OAC given brief isolated episode felt as below, CKD stage III-IV, COPD, HTN, obesity, and OSA intolerant to CPAP secondary to exacerbation of nocturnal cough.   She had previously been seen by Dr. Kirke Corin remotely, though until re-establishing in 04/2018, she had been lost to follow up since 2015. She was initially diagnosed with Afib in 2013 in the setting of decongestant usage and spontaneously converted to sinus rhythm. Echo at that time showed an EF of 55-60%, mild concentric LVH, no RWMA, normal LV diastolic function, mild MR, PASP normal. Given her Afib was brief, she was not placed on OAC at that time. She was most recently seen by Dr. Kirke Corin in 04/2018 for evaluation of worsening leg edema, typically at the end of the day and exertional dyspnea that had been present for 6 months along with chest  discomfort described as heart burn that had been present for 2 weeks, and typically worse after eating as well as a nocturnal cough. She was noted to have been taking omeprazole regularly. Lastly, she reported an approximate 10 pound weight gain.   Prior lung cancer screening CT in 2019 showed no evidence of coronary atherosclerosis with evidence of cardiomegaly and enlarged pulmonary artery and emphysema. Her weight at her visit with Dr. Kirke Corin in 04/2018 was noted to be 293 pounds. EKG showed NSR with PACs. She was felt to be volume overloaded, though there was confusion regarding her medications, as she reported having been changed from triamterene/HCTZ to an alternative diuretic, though she did not know the name of this medication, and we did not have record of it. She underwent Lexiscan Myoview on 05/23/2018 given her chest pain that was without significant ischemia or scar with an EF . 65%. Overall, low risk scan. She underwent echo on 06/16/2018 that showed an EF of 55-60%, normal LV cavity size with mild thickening of the LV, DD, mildly reduced RVSF with mildly increased RV cavity size, RVSP 62.9 mmHg, mild biatrial enlargement, mild to moderate TR, mild MR.  Patient notes some improvement in shortness of breath since transitioning to Lasix 40 mg daily.  However, she does report she has been taking both Maxide and Lasix daily since she was started on furosemide back in February.  She has a stable two-pillow orthopnea.  She indicates her lower extremity swelling is typically improved in the morning hours and  subsequently worsens throughout the day.  She does elevate her legs in her recliner sometimes, though not consistently.  She does not wear compression stockings.  She does report a diet high in sodium as she frequently eats convenience foods and out at restaurants.  She does not have a blood pressure cuff at home to provide [place] with any readings.  She indicates checking her weight approximately 1 time per  week.  She reports a dry weight typically in the 270s to 280s.  Upon chart biopsy she was noted to have a weight of 293 pounds at her nephrologist office in 10/2017.  Most recent serum creatinine from 07/2017 noted to be 1.9 (this was noted in the nephrologist note from 10/2017).  No falls since she was last seen.  No dizziness, presyncope, or syncope.  No chest pain or palpitations.  She remains intolerant to CPAP.  The patient does not have symptoms concerning for COVID-19 infection (fever, chills, cough, or new SHORTNESS OF BREATH).    Prior CV studies:   The following studies were reviewed today:  2D echo 06/16/2018: 1. The left ventricle has normal systolic function, with an ejection fraction of 55-60%. The cavity size was normal. There is mildly increased left ventricular wall thickness. Left ventricular diastolic Doppler parameters are consistent with impaired  relaxation.  2. The right ventricle has mildly reduced systolic function. The cavity was mildly enlarged. There is moderately increased right ventricular wall thickness. Right ventricular systolic pressure is severely elevated with an estimated pressure of 62.9  mmHg.  3. Left atrial size was mildly dilated.  4. Right atrial size was mildly dilated.  5. Tricuspid valve regurgitation is mild-moderate. __________  Myoview 05/23/2018:  Normal pharmacologic myocardial perfusion stress test without significant ischemia or scar.  The left ventricular ejection fraction is normal (>65%).  This is a low risk study. __________  2D echo 02/2012: Study Conclusions  - Left ventricle: The cavity size was normal. There was mild concentric hypertrophy. Systolic function was normal. The estimated ejection fraction was in the range of 55% to 60%. Wall motion was normal; there were no regional wall motion abnormalities. Left ventricular diastolic function parameters were normal. - Mitral valve: Mild regurgitation. - Pulmonary  arteries: Systolic pressure was within the normal range. Impressions:  - Rhythm is sinus bradycardia with rate of 50 bpm. Normal study. __________  Past Medical History:  Diagnosis Date  . A-fib (HCC)   . Allergic rhinitis   . CKD (chronic kidney disease)   . Colon polyps   . COPD (chronic obstructive pulmonary disease) (HCC)   . Hypertension   . Osteoarthritis    right knee  . Pulmonary fibrosis (HCC)    Past Surgical History:  Procedure Laterality Date  . BREAST CYST EXCISION    . BREAST EXCISIONAL BIOPSY Left 1965   NEG  . CATARACT EXTRACTION, BILATERAL    . VAGINAL HYSTERECTOMY       Current Meds  Medication Sig  . Acetaminophen (TYLENOL ARTHRITIS PAIN PO) Take by mouth as needed.  Marland Kitchen albuterol (VENTOLIN HFA) 108 (90 Base) MCG/ACT inhaler Inhale 1-2 puffs into the lungs as needed.   Marland Kitchen aspirin 81 MG tablet Take 81 mg by mouth daily.  . cetirizine (ZYRTEC) 10 MG tablet Take 10 mg by mouth daily.  . Cholecalciferol (VITAMIN D PO) Take 1,000 Int'l Units by mouth daily.   . fluticasone (FLONASE) 50 MCG/ACT nasal spray Place 1 spray into the nose as needed.   . furosemide (LASIX)  40 MG tablet Take 40 mg by mouth daily.  Marland Kitchen losartan (COZAAR) 100 MG tablet Take 100 mg by mouth daily.   . metoprolol succinate (TOPROL-XL) 100 MG 24 hr tablet Take 100 mg by mouth daily.   Marland Kitchen omeprazole (PRILOSEC) 20 MG capsule Take 20 mg by mouth daily.  Marland Kitchen triamterene-hydrochlorothiazide (MAXZIDE) 75-50 MG per tablet Take 1 tablet by mouth daily.     Allergies:   Penicillins; Statins; Pravastatin sodium; and Sulfamethoxazole-trimethoprim   Social History   Tobacco Use  . Smoking status: Former Smoker    Packs/day: 1.50    Years: 47.00    Pack years: 70.50    Types: Cigarettes    Last attempt to quit: 2009    Years since quitting: 11.2  . Smokeless tobacco: Never Used  Substance Use Topics  . Alcohol use: No  . Drug use: No     Family Hx: The patient's family history includes  Kidney failure in her father; Liver cancer in her mother. There is no history of Breast cancer.  ROS:   Please see the history of present illness.     All other systems reviewed and are negative.   Labs/Other Tests and Data Reviewed:    Recent Labs: No results found for requested labs within last 8760 hours.   Recent Lipid Panel No results found for: CHOL, TRIG, HDL, CHOLHDL, LDLCALC, LDLDIRECT  Wt Readings from Last 3 Encounters:  06/20/18 290 lb 6 oz (131.7 kg)  05/18/18 293 lb 4 oz (133 kg)  10/04/17 285 lb (129.3 kg)     Exam:    Vital Signs:  Ht  (1.753 m)   Wt 290 lb 6 oz (131.7 kg)   BMI 42.88 kg/m    Well nourished, well developed female in no acute distress.   ASSESSMENT & PLAN:    1.  Acute on chronic HFpEF/pulmonary hypertension: -Difficult to assess her volume status over the phone though it does appear at least subjectively her symptoms are improving.  Her weight is down 3 pounds when compared to last office visit.  Of note, her weight gain appears to date back at least two 10/2017 when reviewing her chart -Discontinue Maxide given recent initiation of Lasix in an effort to preserve renal function in the setting of her underlying CKD -Increase furosemide to 80 mg daily x3 days followed by resumption of 40 mg daily thereafter -Check BMP in approximately 1 week to assess renal function following outpatient diuresis -I suspect her pulmonary hypertension is in the setting of untreated sleep apnea and COPD secondary to 20 years of tobacco abuse -Following the COVID-19 pandemic, I recommend she be referred to pulmonology -Low-sodium diet -Daily weights  2.  Lower extremity swelling: -Likely multifactorial including the above and dependent edema secondary to venous insufficiency and morbid obesity -Diuresis as above -Recommend leg elevation -She does not have compression stockings and in the setting of the current medical climate with the COVID-19 pandemic we  will defer initiation of these at this time.  However, she does have a straps and will begin to wrap her legs starting with the feet wrapping superiorly each morning and remove the Ace wraps in the evening hours -Recommend she limit her sodium intake  3.  Chest pain: -Resolved -Recent nonischemic Myoview as above -Remains on aspirin and Toprol-XL -Aggressive risk factor modification  4.  PAF: -This was an isolated episode in the setting of decongestant use -In the remote past no symptoms concerning for tachypalpitations -She  has not been maintained on a long-term, full dose anticoagulation given that this was a brief isolated episode  5.  CKD stage III-IV: -Followed by [facility] nephrology -She will need a follow-up BMP in 1 week following outpatient diuresis  6.  Hypertension: -She does not check blood pressures at home -Most recent office blood pressure mildly elevated at 140/82 -Recommend close follow-up when safely possible given COVID-19 pandemic  7.  Morbid obesity: -Weight loss advised  COVID-19 Education: The signs and symptoms of COVID-19 were discussed with the patient and how to seek care for testing (follow up with PCP or arrange E-visit).  The importance of social distancing was discussed today.  Patient Risk:   After full review of this patients clinical status, I feel that they are at least moderate risk at this time.  Time:   Today, I have spent 19 minutes with the patient with telehealth technology discussing the above.     Medication Adjustments/Labs and Tests Ordered: Current medicines are reviewed at length with the patient today.  Concerns regarding medicines are outlined above.  Tests Ordered: No orders of the defined types were placed in this encounter.  Medication Changes: No orders of the defined types were placed in this encounter.   Disposition:  in 1 month(s)  Signed, Eula Listen, PA-C  06/20/2018 9:09 AM    [facility]

## 2018-06-19 ENCOUNTER — Telehealth: Payer: Self-pay | Admitting: *Deleted

## 2018-06-19 NOTE — Telephone Encounter (Signed)
Patient made aware of results and verbalized understanding.  We went over her cardiac medications. She stated that she has been taking:  Aspirin 81 mg daily Metoprolol Succinate 100 mg daily (just increased) Losartan 100 mg daily (just increased) Furosemide 40 mg daily Maxzide 75-50 mg daily  The patient seemed hesitant about the medication and the dosages. She was advised that they will be confirmed again at her telephone visit tomorrow at 9 am.

## 2018-06-19 NOTE — Telephone Encounter (Signed)
-----   Message from [place] Ouch, MD sent at 06/16/2018  5:41 PM EDT ----- Inform patient that echo showed normal LV systolic function but she does have diastolic dysfunction and significant pulmonary hypertension which is likely causing shortness of breath and leg edema.  She will need a stronger diuretic at last time we saw her in clinic she did not know which diuretic she was taking.  She has a virtual visit scheduled next week with Alycia Rossetti and she should update her medication list before that visit.

## 2018-06-20 ENCOUNTER — Telehealth (INDEPENDENT_AMBULATORY_CARE_PROVIDER_SITE_OTHER): Payer: Medicare HMO | Admitting: Physician Assistant

## 2018-06-20 ENCOUNTER — Encounter: Payer: Self-pay | Admitting: Physician Assistant

## 2018-06-20 ENCOUNTER — Other Ambulatory Visit: Payer: Self-pay

## 2018-06-20 VITALS — Ht 69.0 in | Wt 290.4 lb

## 2018-06-20 DIAGNOSIS — N183 Chronic kidney disease, stage 3 unspecified: Secondary | ICD-10-CM

## 2018-06-20 DIAGNOSIS — R079 Chest pain, unspecified: Secondary | ICD-10-CM

## 2018-06-20 DIAGNOSIS — I130 Hypertensive heart and chronic kidney disease with heart failure and stage 1 through stage 4 chronic kidney disease, or unspecified chronic kidney disease: Secondary | ICD-10-CM | POA: Diagnosis not present

## 2018-06-20 DIAGNOSIS — M7989 Other specified soft tissue disorders: Secondary | ICD-10-CM

## 2018-06-20 DIAGNOSIS — Z7982 Long term (current) use of aspirin: Secondary | ICD-10-CM

## 2018-06-20 DIAGNOSIS — I480 Paroxysmal atrial fibrillation: Secondary | ICD-10-CM

## 2018-06-20 DIAGNOSIS — I272 Pulmonary hypertension, unspecified: Secondary | ICD-10-CM | POA: Diagnosis not present

## 2018-06-20 DIAGNOSIS — Z79899 Other long term (current) drug therapy: Secondary | ICD-10-CM

## 2018-06-20 DIAGNOSIS — I5033 Acute on chronic diastolic (congestive) heart failure: Secondary | ICD-10-CM | POA: Diagnosis not present

## 2018-06-20 DIAGNOSIS — I10 Essential (primary) hypertension: Secondary | ICD-10-CM

## 2018-06-20 DIAGNOSIS — R0602 Shortness of breath: Secondary | ICD-10-CM

## 2018-06-20 DIAGNOSIS — Z6841 Body Mass Index (BMI) 40.0 and over, adult: Secondary | ICD-10-CM

## 2018-06-20 MED ORDER — FUROSEMIDE 40 MG PO TABS: 40.0000 mg | ORAL_TABLET | Freq: Every day | ORAL | 11 refills | Status: DC

## 2018-06-20 NOTE — Patient Instructions (Addendum)
It was a pleasure to speak with you on the phone today! Thank you for allowing [place] to continue taking care of your [facility] needs during this time.  Feel free to call as needed for questions and concerns related to your cardiac needs.    Medication Instructions:  Your physician has recommended you make the following change in your medication: 1- STOP Maxzide today 2- INCREASE Lasix to 2 tablets (80 mg total) once daily for 3 days, then RESUME 1 tablet (40 mg total) once daily thereafter.   If you need a refill on your cardiac medications before your next appointment, please call your pharmacy.   Lab work: Your physician recommends that you return for lab work in: 1 week at the medical mall @ [facility]. No appointment needed. Hours 7 AM- 6PM.   If you have labs (blood work) drawn today and your tests are completely normal, you will receive your results only by: Marland Kitchen MyChart Message (if you have MyChart) OR . A paper copy in the mail If you have any lab test that is abnormal or we need to change your treatment, we will call you to review the results.  Testing/Procedures: None ordered  Follow-Up: At [facility], you and your health needs are our priority.  As part of our continuing mission to provide you with exceptional heart care, we have created designated Provider Care Teams.  These Care Teams include your primary Cardiologist (physician) and Advanced Practice Providers (APPs -  Physician Assistants and Nurse Practitioners) who all work together to provide you with the care you need, when you need it. You will need a follow up appointment in 1 months. You may see Lorine Bears, MD or Eula Listen, PA-C using video or telephone visit.  Any Other Special Instructions Will Be Listed Below   For swelling  1- Elevate your legs: Raise your legs above the level of your heart as often as you can. This will help decrease swelling and pain. Prop your legs on pillows or blankets to keep them elevated  comfortably.  2- Wear pressure stockings: These tight stockings put pressure on your legs to promote blood flow and prevent blood clots. Wear the stockings during the day. Do not wear them while you sleep. Place them on first thing in the morning. Start at the feet, wrapping toward the heart.

## 2018-06-27 ENCOUNTER — Other Ambulatory Visit
Admission: RE | Admit: 2018-06-27 | Discharge: 2018-06-27 | Disposition: A | Discharge status: Home / Self Care | Payer: Medicare HMO | Source: Ambulatory Visit | Attending: Physician Assistant | Admitting: Physician Assistant

## 2018-06-27 DIAGNOSIS — R0602 Shortness of breath: Secondary | ICD-10-CM

## 2018-06-27 LAB — BASIC METABOLIC PANEL
Anion gap: 12 (ref 5–15)
BUN: 46 mg/dL — ABNORMAL HIGH (ref 8–23)
CO2: 28 mmol/L (ref 22–32)
Calcium: 9.4 mg/dL (ref 8.9–10.3)
Chloride: 99 mmol/L (ref 98–111)
Creatinine, Ser: 2.23 mg/dL — ABNORMAL HIGH (ref 0.44–1.00)
GFR calc Af Amer: 24 mL/min — ABNORMAL LOW (ref >60)
GFR calc non Af Amer: 21 mL/min — ABNORMAL LOW (ref >60)
Glucose, Bld: 101 mg/dL — ABNORMAL HIGH (ref 70–99)
Potassium: 3.8 mmol/L (ref 3.5–5.1)
Sodium: 139 mmol/L (ref 135–145)

## 2018-06-28 ENCOUNTER — Telehealth: Payer: Self-pay | Admitting: Physician Assistant

## 2018-06-28 MED ORDER — FUROSEMIDE 40 MG PO TABS: 20.0000 mg | ORAL_TABLET | Freq: Every day | ORAL | Status: DC

## 2018-06-28 NOTE — Telephone Encounter (Signed)
The patient is aware of her BMP results. She is aware of Jacquelyn, PA's recommendations to: - hold lasix x 2 days, then - resume lasix at 20 mg once daily  The patient voices understanding of the above and is agreeable.  She has an E-visit scheduled with Ward Givens, NP on 4/30.

## 2018-06-28 NOTE — Telephone Encounter (Signed)
Notes recorded by Lennon Alstrom, PA-C on 06/28/2018 at 8:20 AM EDT Please let [name] know that her labs showed a slight decrease in her kidney function. Please have her hold her lasix for two days. Then, after those two days, please have her decrease her daily dose to 20mg  going forward. Thank you! Holly Townsend, checking [name]'s box)

## 2018-06-28 NOTE — Telephone Encounter (Signed)
Notes recorded by Lennon Alstrom, PA-C on 06/28/2018 at 1:14 PM EDT Yes, please have her (after holding all lasix for two days) drop down to lasix 20mg  ONCE daily. Thank you! ------  Notes recorded by Jefferey Pica, RN on 06/28/2018 at 10:38 AM EDT Jacquelyn- just to clarify- when she resumes it will be lasix 20 mg ONCE daily?? Thanks!

## 2018-07-20 ENCOUNTER — Encounter: Payer: Self-pay | Admitting: Nurse Practitioner

## 2018-07-20 ENCOUNTER — Telehealth (INDEPENDENT_AMBULATORY_CARE_PROVIDER_SITE_OTHER): Payer: Medicare HMO | Admitting: Nurse Practitioner

## 2018-07-20 ENCOUNTER — Other Ambulatory Visit: Payer: Self-pay

## 2018-07-20 VITALS — Ht 69.0 in | Wt 301.0 lb

## 2018-07-20 DIAGNOSIS — I480 Paroxysmal atrial fibrillation: Secondary | ICD-10-CM | POA: Diagnosis not present

## 2018-07-20 DIAGNOSIS — N183 Chronic kidney disease, stage 3 unspecified: Secondary | ICD-10-CM

## 2018-07-20 DIAGNOSIS — I130 Hypertensive heart and chronic kidney disease with heart failure and stage 1 through stage 4 chronic kidney disease, or unspecified chronic kidney disease: Secondary | ICD-10-CM

## 2018-07-20 DIAGNOSIS — I5033 Acute on chronic diastolic (congestive) heart failure: Secondary | ICD-10-CM

## 2018-07-20 DIAGNOSIS — I10 Essential (primary) hypertension: Secondary | ICD-10-CM

## 2018-07-20 MED ORDER — METOLAZONE 2.5 MG PO TABS: 2.5000 mg | ORAL_TABLET | ORAL | 3 refills | Status: DC

## 2018-07-20 NOTE — Progress Notes (Signed)
Virtual Visit via Telephone Note   This visit type was conducted due to national recommendations for restrictions regarding the COVID-19 Pandemic (e.g. social distancing) in an effort to limit this patient's exposure and mitigate transmission in our community.  Due to her co-morbid illnesses, this patient is at least at moderate risk for complications without adequate follow up.  This format is felt to be most appropriate for this patient at this time.  The patient did not have access to video technology/had technical difficulties with video requiring transitioning to audio format only (telephone).  All issues noted in this document were discussed and addressed.  No physical exam could be performed with this format.  Please refer to the patient's chart for her  consent to telehealth for [facility] Evaluation Performed:  Follow-up visit  This visit type was conducted due to national recommendations for restrictions regarding the COVID-19 Pandemic (e.g. social distancing).  This format is felt to be most appropriate for this patient at this time.  All issues noted in this document were discussed and addressed.  No physical exam was performed (except for noted visual exam findings with Video Visits).  Please refer to the patient's chart (MyChart message for video visits and phone note for telephone visits) for the patient's consent to telehealth for [facility]. _____________   Date:  07/20/2018   Patient ID:  Holly Townsend, DOB 10-30-1945, MRN [id number] Patient Location:  home Provider location:   office  Primary Care Provider:  Hyman Hopes, MD Primary Cardiologist:  Lorine Bears, MD  Chief Complaint    73 year old female with a history of paroxysmal atrial fibrillation, HFpEF, pulmonary hypertension, stage III-IV chronic kidney disease, COPD, hypertension, obesity, and sleep apnea, who presents for follow-up of heart failure and weight gain.  Past Medical History     Past Medical History:  Diagnosis Date  . A-fib (HCC)   . Allergic rhinitis   . CKD (chronic kidney disease)   . Colon polyps   . COPD (chronic obstructive pulmonary disease) (HCC)   . Hypertension   . Osteoarthritis    right knee  . Pulmonary fibrosis (HCC)    Past Surgical History:  Procedure Laterality Date  . BREAST CYST EXCISION    . BREAST EXCISIONAL BIOPSY Left 1965   NEG  . CATARACT EXTRACTION, BILATERAL    . VAGINAL HYSTERECTOMY      Allergies  Allergies  Allergen Reactions  . Penicillins Rash and Other (See Comments)  . Statins Other (See Comments)    Other reaction(s): Other (See Comments) myalgias Other reaction(s): Other (See Comments) myalgias myalgias   . Pravastatin Sodium     Other reaction(s): Other (See Comments) myalgias  . Sulfamethoxazole-Trimethoprim Other (See Comments)    History of Present Illness    Holly Townsend is a 73 y.o. female who presents via audio/video conferencing for a telehealth visit today.  Patient does not have any access to a video based platform, and therefore this was carried out as a phone visit.  As above, she has a history of paroxysmal atrial fibrillation which was initially diagnosed in 2013 in the setting of decongestant usage.  Echocardiogram at that time showed normal LV function.  Episode of A. fib was short-lived and she was not placed on anticoagulation.  Other history includes HFpEF, pulmonary hypertension, sleep apnea (intolerant to CPAP), stage III-IV chronic kidney disease, COPD, hypertension, and obesity.  She reestablished care with Dr. Kirke Corin in February 2020 at which time she reported  chest discomfort and dyspnea on exertion.  Weight was also up 10 pounds.  She underwent stress testing in March which was low risk.  Echo in March showed normal LV function with diastolic dysfunction and an RVSP of 62.9 mmHg.  She was transitioned from triamterene/HCTZ to Lasix 40 mg daily but she continued to take the  triamterene HCTZ.  When she followed up on March 31, her weight was down 3 pounds and she noted some improvement in dyspnea and edema.  She was advised to discontinue triamterene HCTZ, use Lasix 80 mg x 3 days, and then resume Lasix 40 mg daily.  She says after doing this, she had weight loss and improvement in functional status.  Follow-up lab work on April 7 showed rising creatinine-2.23.  She was contacted and advised to reduce Lasix back to 20 mg daily.  Since then, she has had greater than 10 pound weight gain with increasing lower extremity edema and dyspnea on exertion.  She has stable two-pillow orthopnea.  She denies PND, chest pain, palpitations, dizziness, syncope, or early satiety.  Though she does have shortness of breath tingling the patient does not have symptoms concerning for COVID-19 infection (fever, chills, cough).   Home Medications    Prior to Admission medications   Medication Sig Start Date End Date Taking? Authorizing Provider  Acetaminophen (TYLENOL ARTHRITIS PAIN PO) Take by mouth as needed.   Yes [provider]  albuterol (VENTOLIN HFA) 108 (90 Base) MCG/ACT inhaler Inhale 1-2 puffs into the lungs as needed.    Yes [provider]  aspirin 81 MG tablet Take 81 mg by mouth daily.   Yes [provider]  cetirizine (ZYRTEC) 10 MG tablet Take 10 mg by mouth daily.   Yes [provider]  Cholecalciferol (VITAMIN D PO) Take 1,000 Int'l Units by mouth daily.    Yes [provider]  doxycycline (VIBRA-TABS) 100 MG tablet Take 100 mg by mouth 2 (two) times a day. 07/19/18  Yes [provider]  fluticasone (FLONASE) 50 MCG/ACT nasal spray Place 1 spray into the nose as needed.  08/16/12  Yes [provider]  furosemide (LASIX) 40 MG tablet Take 0.5 tablets (20 mg total) by mouth daily. 06/28/18  Yes Visser, Harvel Quale D, PA-C  HYDROcodone-Chlorpheniramine 5-4 MG/5ML SOLN Take 5 mLs by mouth at bedtime as needed.   Yes  [provider]  losartan (COZAAR) 100 MG tablet Take 100 mg by mouth daily.  08/31/12  Yes [provider]  metoprolol succinate (TOPROL-XL) 100 MG 24 hr tablet Take 100 mg by mouth daily.    Yes [provider]  omeprazole (PRILOSEC) 20 MG capsule Take 20 mg by mouth daily.   Yes [provider]    Review of Systems    Increasing dyspnea on exertion and lower extremity edema with greater than 10 pound weight gain.  She denies palpitations, PND, dizziness, syncope, or early satiety.  She has stable two-pillow orthopnea..  All other systems reviewed and are otherwise negative except as noted above.  Physical Exam    Vital Signs:  Ht  (1.753 m)   Wt (!) 301 lb (136.5 kg)   BMI 44.45 kg/m    female in no acute distress.  Respirations seem regular and unlabored.  Accessory Clinical Findings    Lab Results  Component Value Date   CREATININE 2.23 (H) 06/27/2018   BUN 46 (H) 06/27/2018   NA 139 06/27/2018   K 3.8 06/27/2018  CL 99 06/27/2018   CO2 28 06/27/2018     2D echo 06/16/2018: 1. The left ventricle has normal systolic function, with an ejection fraction of 55-60%. The cavity size was normal. There is mildly increased left ventricular wall thickness. Left ventricular diastolic Doppler parameters are consistent with impaired  relaxation. 2. The right ventricle has mildly reduced systolic function. The cavity was mildly enlarged. There is moderately increased right ventricular wall thickness. Right ventricular systolic pressure is severely elevated with an estimated pressure of 62.9  mmHg. 3. Left atrial size was mildly dilated. 4. Right atrial size was mildly dilated. 5. Tricuspid valve regurgitation is mild-moderate. __________  Myoview 05/23/2018:  Normal pharmacologic myocardial perfusion stress test without significant ischemia or scar.  The left ventricular ejection fraction is normal (>65%).  This is a low risk  study.  Assessment & Plan    1.  Acute on chronic diastolic congestive heart failure: Patient was evaluated March 31 via phone visit, at which time weight was reported at 290 pounds, down 3 pounds from prior visit.  She continued to report lower extremity edema and though improved, ongoing dyspnea on exertion.  Maxide was discontinued at that time given that she was already using Lasix and chronic kidney disease.  Lasix was increased to 80 mg x 3 days and then dropped to 40 mg daily.  She noted improvement in activity level and tolerance with weight loss after the higher dose but follow-up labs showed rising creatinine of 2.23.  Since then, dose was reduced to 20 mg daily and she has had recurrent weight gain and edema.  We discussed options for management, to potentially include switched to torsemide versus ongoing Lasix therapy with addition of metolazone.  She says she has many Lasix tablets at home and would prefer to try and continue with Lasix.  It appears that 40 mg by itself was previously inadequate and she also had a rise in creatinine above previously known baseline of 1.9.  I have advised to continue Lasix 20 mg daily and I will add metolazone.  She will take 5 mg prior to her Lasix today and then 2.5 mg Monday Wednesday Friday starting tomorrow.  Follow-up labs next Wednesday and follow-up in clinic next Thursday.  She continues to eat fast food regularly and I have advised against this.  She is aware that if she does not have improvement in weight or worsening symptoms prior to her next visit, she may require hospitalization and IV diuresis.  2.  Essential hypertension: She does not check her blood pressure at home and we do not have any data available.  Adjusting diuretics as above.  3.  Stage III-IV chronic kidney disease: This is followed by nephrology at [facility] though she was last seen in August of last year.  Follow-up basic metabolic panel in 1 week.  4.  History of chest pain: No  recurrence.  Recent nonischemic Myoview.  Remains on aspirin and beta-blocker.  Previously intolerant to statins.  5.  PAF:  No palpitations.  On  blocker and asa.  6.  Disposition: Follow-up renal function next Wednesday with follow-up virtual visit in 1 week.  COVID-19 Education: The signs and symptoms of COVID-19 were discussed with the patient and how to seek care for testing (follow up with PCP or arrange E-visit).  The importance of social distancing was discussed today.  Patient Risk:   After full review of this patient's history and clinical status, I feel that he is at least  moderate risk for cardiac complications at this time, thus necessitating a telehealth visit sooner than our first available in office visit.  Time:   Today, I have spent 25 minutes with the patient with telehealth technology discussing heart failure symptoms, diet, and mgmt plan.    Nicolasa Duckinghristopher Andrina Locken, NP 07/20/2018, 9:01 AM

## 2018-07-20 NOTE — Patient Instructions (Signed)
It was a pleasure to speak with you on the phone today! Thank you for allowing [place] to continue taking care of your [facility] needs during this time.   Feel free to call as needed for questions and concerns related to your cardiac needs.   Medication Instructions:  Your physician has recommended you make the following change in your medication:  1- START Metolazone; take 2 tablets (5 mg total) today, take 1 tablet (2.5 total) tomorrow, then take 1 tablet (2.5 mg total) every Monday, Wednesday and Friday there after. If you need a refill on your cardiac medications before your next appointment, please call your pharmacy.   Lab work: 1- Your physician recommends that you return for lab work on Wednesday, 5/6 at the medical mall.  No appt is needed. Hours are M-F 7AM- 6 PM.  If you have labs (blood work) drawn today and your tests are completely normal, you will receive your results only by: Marland Kitchen MyChart Message (if you have MyChart) OR . A paper copy in the mail If you have any lab test that is abnormal or we need to change your treatment, we will call you to review the results.  Testing/Procedures: None ordered  Follow-Up: At [facility], you and your health needs are our priority.  As part of our continuing mission to provide you with exceptional heart care, we have created designated Provider Care Teams.  These Care Teams include your primary Cardiologist (physician) and Advanced Practice Providers (APPs -  Physician Assistants and Nurse Practitioners) who all work together to provide you with the care you need, when you need it.  Follow up appt scheduled for next Thursday May 7th at 9 am with Ward Givens, NP. We will call you 15 min prior to get any vital signs and medication changes.

## 2018-07-26 ENCOUNTER — Other Ambulatory Visit
Admission: RE | Admit: 2018-07-26 | Discharge: 2018-07-26 | Disposition: A | Discharge status: Home / Self Care | Payer: Medicare HMO | Source: Ambulatory Visit | Attending: Nurse Practitioner | Admitting: Nurse Practitioner

## 2018-07-26 ENCOUNTER — Telehealth: Payer: Self-pay | Admitting: *Deleted

## 2018-07-26 DIAGNOSIS — Z79899 Other long term (current) drug therapy: Secondary | ICD-10-CM

## 2018-07-26 DIAGNOSIS — I5033 Acute on chronic diastolic (congestive) heart failure: Secondary | ICD-10-CM

## 2018-07-26 DIAGNOSIS — N183 Chronic kidney disease, stage 3 unspecified: Secondary | ICD-10-CM

## 2018-07-26 DIAGNOSIS — I10 Essential (primary) hypertension: Secondary | ICD-10-CM

## 2018-07-26 LAB — BASIC METABOLIC PANEL
Anion gap: 10 (ref 5–15)
BUN: 42 mg/dL — ABNORMAL HIGH (ref 8–23)
CO2: 31 mmol/L (ref 22–32)
Calcium: 9.7 mg/dL (ref 8.9–10.3)
Chloride: 97 mmol/L — ABNORMAL LOW (ref 98–111)
Creatinine, Ser: 2.11 mg/dL — ABNORMAL HIGH (ref 0.44–1.00)
GFR calc Af Amer: 26 mL/min — ABNORMAL LOW (ref >60)
GFR calc non Af Amer: 22 mL/min — ABNORMAL LOW (ref >60)
Glucose, Bld: 102 mg/dL — ABNORMAL HIGH (ref 70–99)
Potassium: 3.4 mmol/L — ABNORMAL LOW (ref 3.5–5.1)
Sodium: 138 mmol/L (ref 135–145)

## 2018-07-26 MED ORDER — POTASSIUM CHLORIDE CRYS ER 20 MEQ PO TBCR: 20.0000 meq | EXTENDED_RELEASE_TABLET | Freq: Every day | ORAL | 3 refills | Status: AC

## 2018-07-26 NOTE — Telephone Encounter (Signed)
Patient called back and verbalized understanding of results, to start Kdur 20 meq daily, and repeat BMET in 1 week at the [facility]. She will plan to pick up prescription tomorrow.  Rx sent to pharmacy.

## 2018-07-26 NOTE — Telephone Encounter (Signed)
No answer. Left message to call back.   

## 2018-07-26 NOTE — Telephone Encounter (Signed)
-----   Message from Creig Hines, NP sent at 07/26/2018  3:41 PM EDT ----- Creat improved compared to last month.  K down sl in the setting of metolazone.  Please add Kdur daily and f/u bmet in a week to assess stability of lytes/renal fxn. Looks like she's on my schedule for the AM.

## 2018-07-27 ENCOUNTER — Other Ambulatory Visit: Payer: Self-pay

## 2018-07-27 ENCOUNTER — Encounter: Payer: Self-pay | Admitting: Nurse Practitioner

## 2018-07-27 ENCOUNTER — Telehealth (INDEPENDENT_AMBULATORY_CARE_PROVIDER_SITE_OTHER): Payer: Medicare HMO | Admitting: Nurse Practitioner

## 2018-07-27 VITALS — Ht 69.0 in | Wt 292.0 lb

## 2018-07-27 DIAGNOSIS — I5032 Chronic diastolic (congestive) heart failure: Secondary | ICD-10-CM

## 2018-07-27 DIAGNOSIS — I480 Paroxysmal atrial fibrillation: Secondary | ICD-10-CM

## 2018-07-27 DIAGNOSIS — N183 Chronic kidney disease, stage 3 unspecified: Secondary | ICD-10-CM

## 2018-07-27 DIAGNOSIS — I10 Essential (primary) hypertension: Secondary | ICD-10-CM

## 2018-07-27 NOTE — Progress Notes (Signed)
Virtual Visit via Telephone Note   This visit type was conducted due to national recommendations for restrictions regarding the COVID-19 Pandemic (e.g. social distancing) in an effort to limit this patient's exposure and mitigate transmission in our community.  Due to her co-morbid illnesses, this patient is at least at moderate risk for complications without adequate follow up.  This format is felt to be most appropriate for this patient at this time.  The patient did not have access to video technology/had technical difficulties with video requiring transitioning to audio format only (telephone).  All issues noted in this document were discussed and addressed.  No physical exam could be performed with this format.  Please refer to the patient's chart for her  consent to telehealth for [facility]. Evaluation Performed:  Follow-up visit  This visit type was conducted due to national recommendations for restrictions regarding the COVID-19 Pandemic (e.g. social distancing).  This format is felt to be most appropriate for this patient at this time.  All issues noted in this document were discussed and addressed.  No physical exam was performed (except for noted visual exam findings with Video Visits).  Please refer to the patient's chart (MyChart message for video visits and phone note for telephone visits) for the patient's consent to telehealth for [facility]. _____________   Date:  07/27/2018   Patient ID:  Holly Townsend, DOB 04-17-1945, MRN [id number] Patient Location:  home Provider location:   office  Primary Care Provider:  Hyman Hopes, MD Primary Cardiologist:  Lorine Bears, MD  Chief Complaint    73 y/o ? w/ a h/o PAF, HFpEF, PAH, CKD III-IV, COPD, HTN, obesity, and OSA, who presents for CHF f/u after diuretic adjustment last week.  Past Medical History    Past Medical History:  Diagnosis Date  . (HFpEF) heart failure with preserved ejection fraction (HCC)   .  Allergic rhinitis   . CKD (chronic kidney disease) stage 3, GFR 30-59 ml/min (HCC)   . Colon polyps   . COPD (chronic obstructive pulmonary disease) (HCC)   . Hypertension   . Osteoarthritis    right knee  . Paroxysmal atrial fibrillation (HCC)   . Pulmonary fibrosis (HCC)    Past Surgical History:  Procedure Laterality Date  . BREAST CYST EXCISION    . BREAST EXCISIONAL BIOPSY Left 1965   NEG  . CATARACT EXTRACTION, BILATERAL    . VAGINAL HYSTERECTOMY      Allergies  Allergies  Allergen Reactions  . Penicillins Rash and Other (See Comments)  . Statins Other (See Comments)    Other reaction(s): Other (See Comments) myalgias Other reaction(s): Other (See Comments) myalgias myalgias   . Pravastatin Sodium     Other reaction(s): Other (See Comments) myalgias  . Sulfamethoxazole-Trimethoprim Other (See Comments)    History of Present Illness    Holly Townsend is a 73 y.o. female who presents via audio/video conferencing for a telehealth visit today.  She does not have access to a video source and therefore this is a phone visit.  As above, she has a history of paroxysmal atrial fibrillation which was initially diagnosed in 2013 in the setting of decongestant usage.  Echocardiogram at that time showed normal LV function.  Episode of A. fib was short-lived and she was not placed on anticoagulation.  Other history includes HFpEF, pulmonary hypertension, sleep apnea (intolerant to CPAP), stage III-IV chronic kidney disease, COPD, hypertension, and obesity.  She re-established care w/ Dr. Kirke Corin in  04/2018, at which time she reported chest discomfort and DOE w/ 10 lbs wt gain.  Stress testing in 05/2018 was low risk, while echo showed nl EF, diast dysfxn, and PAH w/ an RVSP of 62.9 mmHg.  She was transitioned from triamterene/HCTZ to lasix 40mg  daily but she actually continued all diuretics.  Triamterent/HCTZ was d/c'd at 3/31 f/u and lasix was doubled for three days and then resumed  at 40mg  daily.  This resulted in a rise in creat to 2.23 on 4/7.  Lasix was then reduced to 20mg  daily, which resulted in >10 lbs wt gain with worsening edema and DOE reported at her 4/30 virtual visit.  Metolazone 2.5mg  was added MWF at that point.  F/u creat yesterday was 2.11.  K was low and KCl was added.  Since her last visit, her weight is down 9 pounds.  She has noted significant improvement in lower extremity swelling and also activity tolerance.  She notes just very mild ankle edema at this point.  She no longer experiences dyspnea with usual amounts of activity and has stable two-pillow orthopnea.  She denies PND, chest pain, palpitations, dizziness, syncope, or early satiety.  With weight loss and activity improvement, she has been a little more likely to venture out in public.  She does wear a mask and maintains appropriate social distancing.  The patient does not have symptoms concerning for COVID-19 infection (fever, chills, cough, or new shortness of breath).   Home Medications    Prior to Admission medications   Medication Sig Start Date End Date Taking? Authorizing Provider  Acetaminophen (TYLENOL ARTHRITIS PAIN PO) Take by mouth as needed.    [provider]  albuterol (VENTOLIN HFA) 108 (90 Base) MCG/ACT inhaler Inhale 1-2 puffs into the lungs as needed.     [provider]  aspirin 81 MG tablet Take 81 mg by mouth daily.    [provider]  cetirizine (ZYRTEC) 10 MG tablet Take 10 mg by mouth daily.    [provider]  Cholecalciferol (VITAMIN D PO) Take 1,000 Int'l Units by mouth daily.     [provider]  doxycycline (VIBRA-TABS) 100 MG tablet Take 100 mg by mouth 2 (two) times a day. 07/19/18   [provider]  fluticasone (FLONASE) 50 MCG/ACT nasal spray Place 1 spray into the nose as needed.  08/16/12   [provider]  furosemide (LASIX) 40 MG tablet Take 0.5 tablets (20 mg total) by mouth daily. 06/28/18   Marisue IvanVisser,  Jacquelyn D, PA-C  HYDROcodone-Chlorpheniramine 5-4 MG/5ML SOLN Take 5 mLs by mouth at bedtime as needed.    [provider]  losartan (COZAAR) 100 MG tablet Take 100 mg by mouth daily.  08/31/12   [provider]  metolazone (ZAROXOLYN) 2.5 MG tablet Take 1 tablet (2.5 mg total) by mouth as directed. Take 1 tablet on Monday, Wednesday and Friday. 07/20/18 10/18/18  Creig HinesBerge, Kery Haltiwanger Ronald, NP  metoprolol succinate (TOPROL-XL) 100 MG 24 hr tablet Take 100 mg by mouth daily.     [provider]  omeprazole (PRILOSEC) 20 MG capsule Take 20 mg by mouth daily.    [provider]  potassium chloride SA (K-DUR) 20 MEQ tablet Take 1 tablet (20 mEq total) by mouth daily. 07/26/18   Creig HinesBerge, Dameisha Tschida Ronald, NP    Review of Systems    Much improved since her last visit.  She denies chest pain, dyspnea, palpitations, PND, orthopnea, dizziness, syncope, or early satiety.  She does note  very mild ankle edema which is overall improved.  All other systems reviewed and are otherwise negative except as noted above.  Physical Exam    Vital Signs:  Ht  (1.753 m)   Wt 292 lb (132.5 kg)   BMI 43.12 kg/m  She does not have access to a blood pressure cuff.  This is a phone visit thus exam is limited.  She is awake alert and oriented x3.  She is in no distress and respirations are regular and unlabored.  Accessory Clinical Findings     Lab Results  Component Value Date   CREATININE 2.11 (H) 07/26/2018   BUN 42 (H) 07/26/2018   NA 138 07/26/2018   K 3.4 (L) 07/26/2018   CL 97 (L) 07/26/2018   CO2 31 07/26/2018    2D echo 06/16/2018: 1. The left ventricle has normal systolic function, with an ejection fraction of 55-60%. The cavity size was normal. There is mildly increased left ventricular wall thickness. Left ventricular diastolic Doppler parameters are consistent with impaired  relaxation. 2. The right ventricle has mildly reduced systolic function. The cavity  was mildly enlarged. There is moderately increased right ventricular wall thickness. Right ventricular systolic pressure is severely elevated with an estimated pressure of 62.9  mmHg. 3. Left atrial size was mildly dilated. 4. Right atrial size was mildly dilated. 5. Tricuspid valve regurgitation is mild-moderate. __________  Myoview 05/23/2018:  Normal pharmacologic myocardial perfusion stress test without significant ischemia or scar.  The left ventricular ejection fraction is normal (>65%).  This is a low risk study.   Assessment & Plan    1.  HFpEF: She has recently required adjustments in diuretics related to volume overload and subsequent worsening renal function.  With reduction of Lasix to 20 mg approximately 2 weeks ago, she had significant weight gain up to 301 pounds.  When we talked last week, I added metolazone 2.5 mg on Mondays, Wednesdays, and Fridays.  Since then, she has had 9 pound weight loss and is very close to her previously known dry weight.  She has had significant improvement in in edema and activity tolerance.  Renal function relatively stable creatinine of 2.11 on follow-up labs yesterday.  She was hypokalemic and potassium has been ordered though she has not started this yet.  She does not have the ability to check her heart rate or blood pressure at home.  I will continue her current regimen and plan for follow-up basic metabolic panel in 1 week to reevaluate renal function and potassium.  I will plan to see her back for a virtual visit in 1 month provided that she remains stable.  2.  Essential hypertension: She does not check her blood pressure at home and we do not have any data available.  No changes to regimen today.  3.  Stage III-IV chronic kidney disease: This is followed by nephrology at [facility].  Note from August 2019 indicates that creatinine was 1.92 in May 2019.  Creatinine recently 2.11.  Follow-up basic metabolic panel next week as outlined above.    4.  History of chest pain: No recurrence.  Recent nonischemic Myoview.  Remains on aspirin and beta-blocker.  Previously intolerant to statins.  5.  Paroxysmal atrial fibrillation: No palpitations.  On beta-blocker and aspirin.  6.  Disposition: Follow-up basic metabolic panel in 1 week.  Follow-up with virtual visit in approximately 1 month or sooner if necessary.  COVID-19 Education: The signs and symptoms of COVID-19 were discussed with the  patient and how to seek care for testing (follow up with PCP or arrange E-visit).  The importance of social distancing was discussed today.  Patient Risk:   After full review of this patient's history and clinical status, I feel that he is at least moderate risk for cardiac complications at this time, thus necessitating a telehealth visit sooner than our first available in office visit.  Time:   Today, I have spent 12 minutes with the patient with telehealth technology discussing heart failure management with plans for follow-up of renal function and virtual follow-up in 1 month.  Nicolasa Ducking, NP 07/27/2018, 9:19 AM

## 2018-07-27 NOTE — Patient Instructions (Signed)
It was a pleasure to speak with you on the phone today! Thank you for allowing [place] to continue taking care of your [facility] needs during this time.   Feel free to call as needed for questions and concerns related to your cardiac needs.   Medication Instructions:  Your physician recommends that you continue on your current medications as directed. Please refer to the Current Medication list given to you today.  If you need a refill on your cardiac medications before your next appointment, please call your pharmacy.   Lab work: Your physician recommends that you return for lab work Next Wednesday May 13th at the medical mall.  No appt is needed. Hours are M-F 7AM- 6 PM.  If you have labs (blood work) drawn today and your tests are completely normal, you will receive your results only by: Marland Kitchen MyChart Message (if you have MyChart) OR . A paper copy in the mail If you have any lab test that is abnormal or we need to change your treatment, we will call you to review the results.  Testing/Procedures: None ordered   Follow-Up: At [facility], you and your health needs are our priority.  As part of our continuing mission to provide you with exceptional heart care, we have created designated Provider Care Teams.  These Care Teams include your primary Cardiologist (physician) and Advanced Practice Providers (APPs -  Physician Assistants and Nurse Practitioners) who all work together to provide you with the care you need, when you need it. You will need a follow up e visit appointment in 1 months. You may see Lorine Bears, MD or Nicolasa Ducking, NP.

## 2018-08-03 ENCOUNTER — Other Ambulatory Visit
Admission: RE | Admit: 2018-08-03 | Discharge: 2018-08-03 | Disposition: A | Discharge status: Home / Self Care | Payer: Medicare HMO | Source: Ambulatory Visit | Attending: Nurse Practitioner | Admitting: Nurse Practitioner

## 2018-08-03 DIAGNOSIS — I5033 Acute on chronic diastolic (congestive) heart failure: Secondary | ICD-10-CM | POA: Diagnosis present

## 2018-08-03 DIAGNOSIS — Z79899 Other long term (current) drug therapy: Secondary | ICD-10-CM

## 2018-08-03 DIAGNOSIS — I10 Essential (primary) hypertension: Secondary | ICD-10-CM | POA: Insufficient documentation

## 2018-08-03 LAB — BASIC METABOLIC PANEL
Anion gap: 10 (ref 5–15)
BUN: 47 mg/dL — ABNORMAL HIGH (ref 8–23)
CO2: 26 mmol/L (ref 22–32)
Calcium: 9.3 mg/dL (ref 8.9–10.3)
Chloride: 107 mmol/L (ref 98–111)
Creatinine, Ser: 1.98 mg/dL — ABNORMAL HIGH (ref 0.44–1.00)
GFR calc Af Amer: 28 mL/min — ABNORMAL LOW (ref >60)
GFR calc non Af Amer: 24 mL/min — ABNORMAL LOW (ref >60)
Glucose, Bld: 119 mg/dL — ABNORMAL HIGH (ref 70–99)
Potassium: 4 mmol/L (ref 3.5–5.1)
Sodium: 143 mmol/L (ref 135–145)

## 2018-08-04 ENCOUNTER — Telehealth: Payer: Self-pay

## 2018-08-04 NOTE — Telephone Encounter (Signed)
-----   Message from Creig Hines, NP sent at 08/03/2018  1:20 PM EDT ----- Renal fxn stable.  K nl.

## 2018-08-04 NOTE — Telephone Encounter (Signed)
Pt made aware of lab results with verbalized understanding. 

## 2018-08-18 LAB — UNMAPPED LAB RESULTS: BUN (EXT): 28 mg/dL — ABNORMAL HIGH (ref 7–20)

## 2018-08-29 ENCOUNTER — Other Ambulatory Visit: Payer: Self-pay

## 2018-08-29 ENCOUNTER — Encounter: Payer: Self-pay | Admitting: Nurse Practitioner

## 2018-08-29 ENCOUNTER — Telehealth (INDEPENDENT_AMBULATORY_CARE_PROVIDER_SITE_OTHER): Payer: Medicare HMO | Admitting: Nurse Practitioner

## 2018-08-29 VITALS — Ht 69.0 in | Wt 290.0 lb

## 2018-08-29 DIAGNOSIS — N183 Chronic kidney disease, stage 3 unspecified: Secondary | ICD-10-CM

## 2018-08-29 DIAGNOSIS — I480 Paroxysmal atrial fibrillation: Secondary | ICD-10-CM

## 2018-08-29 DIAGNOSIS — I5032 Chronic diastolic (congestive) heart failure: Secondary | ICD-10-CM | POA: Diagnosis not present

## 2018-08-29 DIAGNOSIS — I10 Essential (primary) hypertension: Secondary | ICD-10-CM

## 2018-08-29 NOTE — Patient Instructions (Signed)
Medication Instructions:  Your physician recommends that you continue on your current medications as directed. Please refer to the Current Medication list given to you today.  If you need a refill on your cardiac medications before your next appointment, please call your pharmacy.   Lab work: Your physician recommends that you return for lab work in: [place] (BMET). - Please go to the [facility]. You will check in at the front desk to the right as you walk into the atrium. Valet Parking is offered if needed. - You will need to come alone and wear a mask.  If you have labs (blood work) drawn today and your tests are completely normal, you will receive your results only by: Marland Kitchen MyChart Message (if you have MyChart) OR . A paper copy in the mail If you have any lab test that is abnormal or we need to change your treatment, we will call you to review the results.  Testing/Procedures: none  Follow-Up: At [facility], you and your health needs are our priority.  As part of our continuing mission to provide you with exceptional heart care, we have created designated Provider Care Teams.  These Care Teams include your primary Cardiologist (physician) and Advanced Practice Providers (APPs -  Physician Assistants and Nurse Practitioners) who all work together to provide you with the care you need, when you need it. You will need a follow up appointment in 3 months [place].  Please call our office 2 months in advance to schedule this appointment.  You may see Kathlyn Sacramento, MD or one of the following Advanced Practice Providers on your designated Care Team:   Murray Hodgkins, NP Christell Faith, PA-C . Marrianne Mood, PA-C

## 2018-08-29 NOTE — Progress Notes (Signed)
Virtual Visit via Telephone Note   This visit type was conducted due to national recommendations for restrictions regarding the COVID-19 Pandemic (e.g. social distancing) in an effort to limit this patient's exposure and mitigate transmission in our community.  Due to her co-morbid illnesses, this patient is at least at moderate risk for complications without adequate follow up.  This format is felt to be most appropriate for this patient at this time.  The patient did not have access to video technology/had technical difficulties with video requiring transitioning to audio format only (telephone).  All issues noted in this document were discussed and addressed.  No physical exam could be performed with this format.  Please refer to the patient's chart for her  consent to telehealth for [facility]. Evaluation Performed:  Follow-up visit  This visit type was conducted due to national recommendations for restrictions regarding the COVID-19 Pandemic (e.g. social distancing).  This format is felt to be most appropriate for this patient at this time.  All issues noted in this document were discussed and addressed.  No physical exam was performed (except for noted visual exam findings with Video Visits).  Please refer to the patient's chart (MyChart message for video visits and phone note for telephone visits) for the patient's consent to telehealth for [facility]. _____________   Date:  08/29/2018   Patient ID:  KEILY LEPP, DOB Apr 23, 1945, MRN [id number] Patient Location:  Home Provider location:   Office  Primary Care Provider:  Ellamae Sia, MD Primary Cardiologist:  Kathlyn Sacramento, MD  Chief Complaint    73 year old female with a history of paroxysmal atrial fibrillation, HFpEF, PAH, CKD 3-4, COPD, hypertension, obesity, and sleep apnea, who presents for virtual visit for CHF follow-up.  Past Medical History    Past Medical History:  Diagnosis Date  . (HFpEF) heart  failure with preserved ejection fraction ([place])   . Allergic rhinitis   . CKD (chronic kidney disease) stage 3, GFR 30-59 ml/min (HCC)   . Colon polyps   . COPD (chronic obstructive pulmonary disease) ([place])   . Hypertension   . Osteoarthritis    right knee  . Paroxysmal atrial fibrillation (HCC)   . Pulmonary fibrosis ([place])    Past Surgical History:  Procedure Laterality Date  . BREAST CYST EXCISION    . BREAST EXCISIONAL BIOPSY Left 1965   NEG  . CATARACT EXTRACTION, BILATERAL    . VAGINAL HYSTERECTOMY      Allergies  Allergies  Allergen Reactions  . Penicillins Rash and Other (See Comments)  . Statins Other (See Comments)    Other reaction(s): Other (See Comments) myalgias Other reaction(s): Other (See Comments) myalgias myalgias   . Pravastatin Sodium     Other reaction(s): Other (See Comments) myalgias  . Sulfamethoxazole-Trimethoprim Other (See Comments)    History of Present Illness    COLLYNS MCQUIGG is a 73 y.o. female who presents via audio/video conferencing for a telehealth visit today.  She does not have access to a video source and therefore, this is a phone visit.  As above, she has a history of paroxysmal atrial fibrillation which was initially diagnosed in 2013 in the setting of decongestant usage.  Echocardiogram at that time showed normal LV function.  Episode of A. fib was short-lived and she was not placed on anticoagulation.  Other history includes HFpEF, pulmonary hypertension, sleep apnea (intolerant to CPAP), stage III-IV chronic kidney disease, COPD, hypertension, and obesity.  In February 2020, she reestablished care  with Dr. Kirke CorinArida and complained of chest discomfort and dyspnea on exertion with 10 pound weight gain.  Stress testing in March 2020 was low risk while echo showed normal LV function with diastolic dysfunction, and pulmonary hypertension with RVSP of 62.9 mmHg.  We have been adjusting her diuretics since then in order to address her  volume status while also trying to avoid worsening renal insufficiency.  She has been on a stable dose of Lasix 20 mg daily and metolazone 2.5 mg on Monday, Wednesday, and Friday since late April.  Follow-up lab work last month showed stable renal function and potassium.  Since her last telephonic visit, she has been doing well.  Her weight is down 2 additional pound since her last visit.  She notes mild pedal and ankle edema at the end of the day which is not present first thing in the morning.  She continues to do her own grocery shopping and walk some around her home without any dyspnea on exertion.  She denies chest pain, palpitations, PND, dizziness, syncope, or early satiety.  She has chronic, stable two-pillow orthopnea.  She has been careful when out in public and always wears a mask.  The patient does not have symptoms concerning for COVID-19 infection (fever, chills, cough, or new shortness of breath).   Home Medications    Prior to Admission medications   Medication Sig Start Date End Date Taking? Authorizing Provider  Acetaminophen (TYLENOL ARTHRITIS PAIN PO) Take by mouth as needed.   Yes [provider]  albuterol (VENTOLIN HFA) 108 (90 Base) MCG/ACT inhaler Inhale 1-2 puffs into the lungs as needed.    Yes [provider]  aspirin 81 MG tablet Take 81 mg by mouth daily.   Yes [provider]  cetirizine (ZYRTEC) 10 MG tablet Take 10 mg by mouth daily.   Yes [provider]  Cholecalciferol (VITAMIN D PO) Take 1,000 Int'l Units by mouth daily.    Yes [provider]  fluticasone (FLONASE) 50 MCG/ACT nasal spray Place 1 spray into the nose as needed.  08/16/12  Yes [provider]  furosemide (LASIX) 40 MG tablet Take 0.5 tablets (20 mg total) by mouth daily. 06/28/18  Yes Visser, Harvel QualeJacquelyn D, PA-C  HYDROcodone-Chlorpheniramine 5-4 MG/5ML SOLN Take 5 mLs by mouth at bedtime as needed.   Yes [provider]  losartan (COZAAR) 100  MG tablet Take 100 mg by mouth daily.  08/31/12  Yes [provider]  metolazone (ZAROXOLYN) 2.5 MG tablet Take 1 tablet (2.5 mg total) by mouth as directed. Take 1 tablet on Monday, Wednesday and Friday. 07/20/18 10/18/18 Yes Creig HinesBerge, Anthoney Sheppard Ronald, NP  metoprolol succinate (TOPROL-XL) 100 MG 24 hr tablet Take 100 mg by mouth daily.    Yes [provider]  omeprazole (PRILOSEC) 20 MG capsule Take 20 mg by mouth daily.   Yes [provider]  potassium chloride SA (K-DUR) 20 MEQ tablet Take 1 tablet (20 mEq total) by mouth daily. 07/26/18  Yes Creig HinesBerge, Johanna Stafford Ronald, NP    Review of Systems    Mild pedal and ankle edema at the end of the day which is not present first thing in the morning.  She has stable two-pillow orthopnea.  She denies chest pain, dyspnea, palpitations, PND, dizziness, syncope, or early satiety.  All other systems reviewed and are otherwise negative except as noted above.  Physical Exam    Vital Signs:  Ht 5\' 9"  (1.753 m)   Wt 290 lb (131.5  kg)   BMI 42.83 kg/m    Phone visit-Pleasant, no acute distress, awake alert and oriented x3.  Speech is unlabored.  Accessory Clinical Findings    Lab Results  Component Value Date   CREATININE 1.98 (H) 08/03/2018   BUN 47 (H) 08/03/2018   NA 143 08/03/2018   K 4.0 08/03/2018   CL 107 08/03/2018   CO2 26 08/03/2018     Assessment & Plan    1.  HFpEF: Patient continues to do well from a volume standpoint.  Weight is down 2 pounds since her last visit.  She is tolerating Lasix 20 mg daily and metolazone 2.5 mg on Mondays, Wednesdays, and Fridays.  Renal function was stable at last evaluation in mid May.  I will arrange for a follow-up basic metabolic panel to ensure stability.  She does not have the ability to check her blood pressure or heart rate at home but symptomatically has been doing well.  2.  Essential hypertension: She does not have a blood pressure cuff at home and thus no data available.   No changes to regimen today.  3.  Stage III-IV chronic kidney disease: [facility]  She says she is not due back there for a year.  Creatinine was 1.98 after initiating low-dose Lasix and metolazone.  Follow-up basic metabolic panel in order to ensure stability.  4.  History of chest pain: No recurrence.  Nonischemic Myoview earlier this year.  Continue aspirin and beta-blocker.  Previously intolerant to statins.  5.  Paroxysmal atrial fibrillation: This occurred in the setting of decongestant use and was short-lived.  She has not been having any palpitations.  She remains on beta-blocker and aspirin therapy.  6.  Disposition: Follow-up basic metabolic panel within the next week.  Follow-up in clinic in 3 months or sooner if necessary.  COVID-19 Education: The signs and symptoms of COVID-19 were discussed with the patient and how to seek care for testing (follow up with PCP or arrange E-visit).  The importance of social distancing was discussed today.  Patient Risk:   After full review of this patient's history and clinical status, I feel that he is at least moderate risk for cardiac complications at this time, thus necessitating a telehealth visit sooner than our first available in office visit.  Time:   Today, I have spent 17 minutes with the patient with telehealth technology discussing medical history, symptoms, and management plan.     Nicolasa Duckinghristopher Aliviana Burdell, NP 08/29/2018, 1:11 PM

## 2018-09-04 ENCOUNTER — Other Ambulatory Visit
Admission: RE | Admit: 2018-09-04 | Discharge: 2018-09-04 | Disposition: A | Discharge status: Home / Self Care | Payer: Medicare HMO | Source: Ambulatory Visit | Attending: Nurse Practitioner | Admitting: Nurse Practitioner

## 2018-09-04 DIAGNOSIS — I5032 Chronic diastolic (congestive) heart failure: Secondary | ICD-10-CM | POA: Diagnosis present

## 2018-09-04 DIAGNOSIS — I10 Essential (primary) hypertension: Secondary | ICD-10-CM | POA: Diagnosis present

## 2018-09-04 LAB — BASIC METABOLIC PANEL
Anion gap: 12 (ref 5–15)
BUN: 43 mg/dL — ABNORMAL HIGH (ref 8–23)
CO2: 27 mmol/L (ref 22–32)
Calcium: 9.4 mg/dL (ref 8.9–10.3)
Chloride: 100 mmol/L (ref 98–111)
Creatinine, Ser: 2.11 mg/dL — ABNORMAL HIGH (ref 0.44–1.00)
GFR calc Af Amer: 26 mL/min — ABNORMAL LOW (ref >60)
GFR calc non Af Amer: 22 mL/min — ABNORMAL LOW (ref >60)
Glucose, Bld: 101 mg/dL — ABNORMAL HIGH (ref 70–99)
Potassium: 3.8 mmol/L (ref 3.5–5.1)
Sodium: 139 mmol/L (ref 135–145)

## 2018-09-06 ENCOUNTER — Encounter: Payer: Self-pay | Admitting: *Deleted

## 2018-09-11 ENCOUNTER — Other Ambulatory Visit: Payer: Self-pay | Admitting: Internal Medicine

## 2018-09-11 DIAGNOSIS — Z1231 Encounter for screening mammogram for malignant neoplasm of breast: Secondary | ICD-10-CM

## 2018-10-19 ENCOUNTER — Other Ambulatory Visit: Payer: Self-pay

## 2018-10-19 ENCOUNTER — Ambulatory Visit
Admission: RE | Admit: 2018-10-19 | Discharge: 2018-10-19 | Disposition: A | Discharge status: Home / Self Care | Payer: Medicare HMO | Source: Ambulatory Visit | Attending: Internal Medicine | Admitting: Internal Medicine

## 2018-10-19 DIAGNOSIS — Z1231 Encounter for screening mammogram for malignant neoplasm of breast: Secondary | ICD-10-CM | POA: Insufficient documentation

## 2018-10-23 ENCOUNTER — Telehealth: Payer: Self-pay | Admitting: *Deleted

## 2018-10-23 DIAGNOSIS — Z87891 Personal history of nicotine dependence: Secondary | ICD-10-CM

## 2018-10-23 DIAGNOSIS — Z122 Encounter for screening for malignant neoplasm of respiratory organs: Secondary | ICD-10-CM

## 2018-10-23 NOTE — Telephone Encounter (Signed)
Patient has been notified that annual lung cancer screening low dose CT scan is due currently or will be in near future. Confirmed that patient is within the age range of 55-77, and asymptomatic, (no signs or symptoms of lung cancer). Patient denies illness that would prevent curative treatment for lung cancer if found. Verified smoking history, (former, quit 2009, 70.5 pack year). The shared decision making visit was done 10/04/17. Patient is agreeable for CT scan being scheduled.

## 2018-10-23 NOTE — Telephone Encounter (Signed)
Left message for patient to notify them that it is time to schedule annual low dose lung cancer screening CT scan. Instructed patient to call back to verify information prior to the scan being scheduled.  

## 2018-10-31 ENCOUNTER — Other Ambulatory Visit: Payer: Self-pay

## 2018-10-31 ENCOUNTER — Ambulatory Visit
Admission: RE | Admit: 2018-10-31 | Discharge: 2018-10-31 | Disposition: A | Discharge status: Home / Self Care | Payer: Medicare HMO | Source: Ambulatory Visit | Attending: Oncology | Admitting: Oncology

## 2018-10-31 DIAGNOSIS — Z122 Encounter for screening for malignant neoplasm of respiratory organs: Secondary | ICD-10-CM | POA: Insufficient documentation

## 2018-10-31 DIAGNOSIS — Z87891 Personal history of nicotine dependence: Secondary | ICD-10-CM

## 2018-11-01 ENCOUNTER — Telehealth: Payer: Self-pay | Admitting: *Deleted

## 2018-11-01 NOTE — Telephone Encounter (Signed)
Notified patient of LDCT lung cancer screening program results with recommendation for 12 month follow up imaging. Also notified of incidental findings noted below and is encouraged to discuss further with PCP who will receive a copy of this note and/or the CT report. Patient verbalizes understanding.   IMPRESSION: 1. Lung-RADS 2, benign appearance or behavior. Continue annual screening with low-dose chest CT without contrast in 12 months. 2. Aortic atherosclerosis. 3. Mild diffuse bronchial wall thickening with mild centrilobular and paraseptal emphysema; imaging findings suggestive of underlying COPD.  Aortic Atherosclerosis (ICD10-I70.0) and Emphysema (ICD10-J43.9).  

## 2019-03-30 ENCOUNTER — Telehealth (INDEPENDENT_AMBULATORY_CARE_PROVIDER_SITE_OTHER): Payer: Medicare HMO | Admitting: Cardiovascular Disease

## 2019-03-30 ENCOUNTER — Encounter: Payer: Self-pay | Admitting: Cardiovascular Disease

## 2019-03-30 ENCOUNTER — Other Ambulatory Visit: Payer: Self-pay

## 2019-03-30 VITALS — Ht 69.0 in | Wt 290.0 lb

## 2019-03-30 DIAGNOSIS — I10 Essential (primary) hypertension: Secondary | ICD-10-CM

## 2019-03-30 DIAGNOSIS — I5032 Chronic diastolic (congestive) heart failure: Secondary | ICD-10-CM

## 2019-03-30 NOTE — Patient Instructions (Signed)
Medication Instructions:  Continue same medications *If you need a refill on your cardiac medications before your next appointment, please call your pharmacy*  Lab Work: None If you have labs (blood work) drawn today and your tests are completely normal, you will receive your results only by: . MyChart Message (if you have MyChart) OR . A paper copy in the mail If you have any lab test that is abnormal or we need to change your treatment, we will call you to review the results.  Testing/Procedures: None  Follow-Up: At [facility], you and your health needs are our priority.  As part of our continuing mission to provide you with exceptional heart care, we have created designated Provider Care Teams.  These Care Teams include your primary Cardiologist (physician) and Advanced Practice Providers (APPs -  Physician Assistants and Nurse Practitioners) who all work together to provide you with the care you need, when you need it.  Your next appointment:   4 months  The format for your next appointment:   In Person  Provider:    You may see Dalene Robards, MD or one of the following Advanced Practice Providers on your designated Care Team:    Christopher Berge, NP  [name] Dunn, PA-C  Jacquelyn Visser, PA-C   

## 2019-03-30 NOTE — Progress Notes (Signed)
Virtual Visit via Telephone Note   This visit type was conducted due to national recommendations for restrictions regarding the COVID-19 Pandemic (e.g. social distancing) in an effort to limit this patient's exposure and mitigate transmission in our community.  Due to her co-morbid illnesses, this patient is at least at moderate risk for complications without adequate follow up.  This format is felt to be most appropriate for this patient at this time.  The patient did not have access to video technology/had technical difficulties with video requiring transitioning to audio format only (telephone).  All issues noted in this document were discussed and addressed.  No physical exam could be performed with this format.  Please refer to the patient's chart for her  consent to telehealth for [facility].   Date:  03/30/2019   ID:  Holly Townsend, DOB 10-05-1945, MRN [id number]  Patient Location: Home Provider Location: Office  PCP:  Hyman Hopes, MD  Cardiologist:  Lorine Bears, MD  Electrophysiologist:  None   Evaluation Performed:  Follow-Up Visit  Chief Complaint: Doing well overall.  History of Present Illness:    Holly Townsend is a 74 y.o. female was reached via phone for follow-up visit regarding chronic diastolic heart failure with pulmonary hypertension.   She has known history of paroxysmal atrial fibrillation, sleep apnea, obesity, stage III chronic kidney disease, essential hypertension and previous tobacco use. She was diagnosed with atrial fibrillation in 2013 in the setting of use of decongestants.  She converted spontaneously to sinus rhythm.  No recurrent atrial fibrillation since then and thus she has not been anticoagulated due to low burden.  She was seen last year for worsening exertional dyspnea and leg edema. She had a CT scan for lung cancer screening in 2019 which showed no evidence of coronary atherosclerosis.  However, it did show evidence of  cardiomegaly and enlarged pulmonary artery.  There was evidence of emphysema on CT scan. Echocardiogram in March 2020 showed normal LV systolic function with moderate to severe pulmonary hypertension with estimated systolic pulmonary pressure of 63 mmHg.  There was mild to moderate tricuspid regurgitation.  Lexiscan Myoview showed no evidence of ischemia.  Her diuretics have been adjusted and currently she takes furosemide 20 mg daily and metolazone 2.5 mg 3 times a week.  She denies leg edema or shortness of breath and her weight has been stable. .   The patient does not have symptoms concerning for COVID-19 infection (fever, chills, cough, or new shortness of breath).    Past Medical History:  Diagnosis Date  . (HFpEF) heart failure with preserved ejection fraction (HCC)   . Allergic rhinitis   . CKD (chronic kidney disease) stage 3, GFR 30-59 ml/min   . Colon polyps   . COPD (chronic obstructive pulmonary disease) (HCC)   . Hypertension   . Osteoarthritis    right knee  . Paroxysmal atrial fibrillation (HCC)   . Pulmonary fibrosis (HCC)    Past Surgical History:  Procedure Laterality Date  . BREAST EXCISIONAL BIOPSY Left 1965   NEG  . CATARACT EXTRACTION, BILATERAL    . VAGINAL HYSTERECTOMY       Current Meds  Medication Sig  . Acetaminophen (TYLENOL ARTHRITIS PAIN PO) Take by mouth 2 (two) times daily.   Marland Kitchen albuterol (VENTOLIN HFA) 108 (90 Base) MCG/ACT inhaler Inhale 1-2 puffs into the lungs as needed.   Marland Kitchen aspirin 81 MG tablet Take 81 mg by mouth daily.  . cetirizine (ZYRTEC) 10 MG  tablet Take 10 mg by mouth daily.  . Cholecalciferol (VITAMIN D PO) Take 1,000 Int'l Units by mouth daily.   . fluticasone (FLONASE) 50 MCG/ACT nasal spray Place 1 spray into the nose as needed.   . furosemide (LASIX) 40 MG tablet Take 0.5 tablets (20 mg total) by mouth daily.  Marland Kitchen HYDROcodone-Chlorpheniramine 5-4 MG/5ML SOLN Take 5 mLs by mouth at bedtime as needed.  Marland Kitchen losartan (COZAAR) 100 MG  tablet Take 100 mg by mouth daily.   . metolazone (ZAROXOLYN) 2.5 MG tablet Take 1 tablet (2.5 mg total) by mouth as directed. Take 1 tablet on Monday, Wednesday and Friday.  . metoprolol succinate (TOPROL-XL) 100 MG 24 hr tablet Take 100 mg by mouth daily.   Marland Kitchen omeprazole (PRILOSEC) 20 MG capsule Take 20 mg by mouth daily.  . potassium chloride SA (K-DUR) 20 MEQ tablet Take 1 tablet (20 mEq total) by mouth daily. (Patient taking differently: Take 20 mEq by mouth daily. Taking QOD)     Allergies:   Penicillins, Statins, Pravastatin sodium, and Sulfamethoxazole-trimethoprim   Social History   Tobacco Use  . Smoking status: Former Smoker    Packs/day: 1.50    Years: 47.00    Pack years: 70.50    Types: Cigarettes    Quit date: 2009    Years since quitting: 12.0  . Smokeless tobacco: Never Used  Substance Use Topics  . Alcohol use: No  . Drug use: No     Family Hx: The patient's family history includes Kidney failure in her father; Liver cancer in her mother. There is no history of Breast cancer.  ROS:   Please see the history of present illness.     All other systems reviewed and are negative.   Prior CV studies:   The following studies were reviewed today:    Labs/Other Tests and Data Reviewed:    EKG:  No ECG reviewed.  Recent Labs: 09/04/2018: BUN 43; Creatinine, Ser 2.11; Potassium 3.8; Sodium 139   Recent Lipid Panel No results found for: CHOL, TRIG, HDL, CHOLHDL, LDLCALC, LDLDIRECT  Wt Readings from Last 3 Encounters:  03/30/19 290 lb (131.5 kg)  10/31/18 285 lb (129.3 kg)  08/29/18 290 lb (131.5 kg)     Objective:    Vital Signs:  Ht 5\' 9"  (1.753 m)   Wt 290 lb (131.5 kg)   BMI 42.83 kg/m    VITAL SIGNS:  reviewed  ASSESSMENT & PLAN:    1.  Chronic diastolic heart failure with moderate severe pulmonary hypertension: The patient improved after adjusting her diuretics and based on her symptoms and weight, she appears to be euvolemic.  Continue  furosemide and metolazone as instructed.  3.  Essential hypertension: The patient is not able to check her blood pressure at home.  Continue same medications for now.  3.  Chronic kidney disease: Followed by [facility] nephrology.  4.  Previous episode of atrial fibrillation in the setting of decongestant use with no recurrent episodes.  No need for anticoagulation unless she develops recurrent episodes.  COVID-19 Education: The signs and symptoms of COVID-19 were discussed with the patient and how to seek care for testing (follow up with PCP or arrange E-visit).  The importance of social distancing was discussed today.  Time:   Today, I have spent 8 minutes with the patient with telehealth technology discussing the above problems.     Medication Adjustments/Labs and Tests Ordered: Current medicines are reviewed at length with the patient today.  Concerns  regarding medicines are outlined above.   Tests Ordered: No orders of the defined types were placed in this encounter.   Medication Changes: No orders of the defined types were placed in this encounter.   Follow Up:  In Person in 4 month(s)  Signed, Lorine Bears, MD  03/30/2019 3:01 PM    [facility]

## 2019-05-28 ENCOUNTER — Other Ambulatory Visit: Payer: Self-pay

## 2019-05-28 ENCOUNTER — Other Ambulatory Visit
Admission: RE | Admit: 2019-05-28 | Discharge: 2019-05-28 | Disposition: A | Discharge status: Home / Self Care | Payer: Medicare HMO | Source: Ambulatory Visit | Attending: Internal Medicine | Admitting: Internal Medicine

## 2019-05-28 DIAGNOSIS — Z01812 Encounter for preprocedural laboratory examination: Secondary | ICD-10-CM | POA: Insufficient documentation

## 2019-05-28 DIAGNOSIS — Z20822 Contact with and (suspected) exposure to covid-19: Secondary | ICD-10-CM | POA: Insufficient documentation

## 2019-05-29 LAB — SARS CORONAVIRUS 2 (TAT 6-24 HRS): SARS Coronavirus 2: NEGATIVE

## 2019-05-30 ENCOUNTER — Other Ambulatory Visit: Payer: Self-pay

## 2019-05-30 ENCOUNTER — Ambulatory Visit
Admission: RE | Admit: 2019-05-30 | Discharge: 2019-05-30 | Disposition: A | Discharge status: Home / Self Care | Payer: Medicare HMO | Attending: Internal Medicine | Admitting: Internal Medicine

## 2019-05-30 ENCOUNTER — Ambulatory Visit: Payer: Medicare HMO | Admitting: Certified Registered Nurse Anesthetist

## 2019-05-30 ENCOUNTER — Encounter
Admission: RE | Disposition: A | Discharge status: Home / Self Care | Payer: Self-pay | Source: Home / Self Care | Attending: Internal Medicine

## 2019-05-30 ENCOUNTER — Encounter: Payer: Self-pay | Admitting: Internal Medicine

## 2019-05-30 DIAGNOSIS — N183 Chronic kidney disease, stage 3 unspecified: Secondary | ICD-10-CM | POA: Diagnosis not present

## 2019-05-30 DIAGNOSIS — Z1211 Encounter for screening for malignant neoplasm of colon: Secondary | ICD-10-CM | POA: Insufficient documentation

## 2019-05-30 DIAGNOSIS — Z7982 Long term (current) use of aspirin: Secondary | ICD-10-CM | POA: Insufficient documentation

## 2019-05-30 DIAGNOSIS — Z79899 Other long term (current) drug therapy: Secondary | ICD-10-CM | POA: Diagnosis not present

## 2019-05-30 DIAGNOSIS — G4733 Obstructive sleep apnea (adult) (pediatric): Secondary | ICD-10-CM | POA: Insufficient documentation

## 2019-05-30 DIAGNOSIS — I5032 Chronic diastolic (congestive) heart failure: Secondary | ICD-10-CM | POA: Insufficient documentation

## 2019-05-30 DIAGNOSIS — K640 First degree hemorrhoids: Secondary | ICD-10-CM | POA: Insufficient documentation

## 2019-05-30 DIAGNOSIS — I130 Hypertensive heart and chronic kidney disease with heart failure and stage 1 through stage 4 chronic kidney disease, or unspecified chronic kidney disease: Secondary | ICD-10-CM | POA: Insufficient documentation

## 2019-05-30 DIAGNOSIS — Z87891 Personal history of nicotine dependence: Secondary | ICD-10-CM | POA: Insufficient documentation

## 2019-05-30 DIAGNOSIS — J449 Chronic obstructive pulmonary disease, unspecified: Secondary | ICD-10-CM | POA: Diagnosis not present

## 2019-05-30 DIAGNOSIS — Z86010 Personal history of colonic polyps: Secondary | ICD-10-CM | POA: Diagnosis not present

## 2019-05-30 DIAGNOSIS — K5730 Diverticulosis of large intestine without perforation or abscess without bleeding: Secondary | ICD-10-CM | POA: Diagnosis not present

## 2019-05-30 HISTORY — PX: COLONOSCOPY WITH PROPOFOL: SHX5780

## 2019-05-30 SURGERY — COLONOSCOPY WITH PROPOFOL
Anesthesia: General

## 2019-05-30 MED ORDER — PROPOFOL 500 MG/50ML IV EMUL
INTRAVENOUS | Status: DC | PRN
  Administered 2019-05-30: 75 ug/kg/min via INTRAVENOUS
  Administered 2019-05-30: 50 mg via INTRAVENOUS

## 2019-05-30 MED ORDER — SODIUM CHLORIDE 0.9 % IV SOLN: INTRAVENOUS | Status: DC

## 2019-05-30 MED ORDER — PROPOFOL 500 MG/50ML IV EMUL
INTRAVENOUS | Status: AC
  Filled 2019-05-30: qty 50

## 2019-05-30 MED ORDER — LIDOCAINE HCL (CARDIAC) PF 100 MG/5ML IV SOSY
PREFILLED_SYRINGE | INTRAVENOUS | Status: DC | PRN
  Administered 2019-05-30: 60 mg via INTRAVENOUS

## 2019-05-30 MED ORDER — VASOPRESSIN 20 UNIT/ML IV SOLN
INTRAVENOUS | Status: DC | PRN
  Administered 2019-05-30 (×2): 1 [IU] via INTRAVENOUS

## 2019-05-30 NOTE — Interval H&P Note (Signed)
History and Physical Interval Note:  05/30/2019 1:29 PM  Holly Townsend  has presented today for surgery, with the diagnosis of PERSONAL HX.OF COLON POLYPS.  The various methods of treatment have been discussed with the patient and family. After consideration of risks, benefits and other options for treatment, the patient has consented to  Procedure(s): COLONOSCOPY WITH PROPOFOL (N/A) as a surgical intervention.  The patient's history has been reviewed, patient examined, no change in status, stable for surgery.  I have reviewed the patient's chart and labs.  Questions were answered to the patient's satisfaction.     [place], [place]

## 2019-05-30 NOTE — Transfer of Care (Signed)
Immediate Anesthesia Transfer of Care Note  Patient: KAZI MONTORO  Procedure(s) Performed: COLONOSCOPY WITH PROPOFOL (N/A )  Patient Location: PACU  Anesthesia Type:General  Level of Consciousness: awake, alert  and oriented  Airway & Oxygen Therapy: Patient Spontanous Breathing  Post-op Assessment: Report given to RN  Post vital signs: Reviewed and stable  Last Vitals:  Vitals Value Taken Time  BP 101/64 05/30/19 1410  Temp 36.2 C 05/30/19 1408  Pulse 72 05/30/19 1410  Resp 13 05/30/19 1410  SpO2 92 % 05/30/19 1410    Last Pain:  Vitals:   05/30/19 1408  TempSrc: Temporal  PainSc: 0-No pain         Complications: No apparent anesthesia complications

## 2019-05-30 NOTE — Interval H&P Note (Signed)
History and Physical Interval Note:  05/30/2019 1:29 PM  Holly Townsend  has presented today for surgery, with the diagnosis of PERSONAL HX.OF COLON POLYPS.  The various methods of treatment have been discussed with the patient and family. After consideration of risks, benefits and other options for treatment, the patient has consented to  Procedure(s): COLONOSCOPY WITH PROPOFOL (N/A) as a surgical intervention.  The patient's history has been reviewed, patient examined, no change in status, stable for surgery.  I have reviewed the patient's chart and labs.  Questions were answered to the patient's satisfaction.     Correll Denbow   

## 2019-05-30 NOTE — Op Note (Signed)
[facility] Gastroenterology Patient Name: Holly Townsend Procedure Date: 05/30/2019 1:22 PM MRN: [id number] Account #: [id number] Date of Birth: Mar 11, 1946 Admit Type: Outpatient Age: 74 Room: [facility] ENDO ROOM 3 Gender: Female Note Status: Finalized Procedure:             Colonoscopy Indications:           Surveillance: Personal history of adenomatous polyps                         on last colonoscopy > 5 years ago Providers:             Royce Macadamia K. Dulcinea Kinser MD, MD Medicines:             Propofol per Anesthesia Complications:         No immediate complications. Procedure:             Pre-Anesthesia Assessment:                        - The risks and benefits of the procedure and the                         sedation options and risks were discussed with the                         patient. All questions were answered and informed                         consent was obtained.                        - Patient identification and proposed procedure were                         verified prior to the procedure by the nurse. The                         procedure was verified in the procedure room.                        - ASA Grade Assessment: III - A patient with severe                         systemic disease.                        - After reviewing the risks and benefits, the patient                         was deemed in satisfactory condition to undergo the                         procedure.                        After obtaining informed consent, the colonoscope was                         passed under direct vision. Throughout the procedure,  the patient's blood pressure, pulse, and oxygen                         saturations were monitored continuously. The                         Colonoscope was introduced through the anus and                         advanced to the the cecum, identified by appendiceal                         orifice and  ileocecal valve. The colonoscopy was                         somewhat difficult due to significant looping.                         Successful completion of the procedure was aided by                         Anesthesia staff assisting with sedation and applying                         abdominal pressure. The patient tolerated the                         procedure well. The quality of the bowel preparation                         was adequate. The ileocecal valve, appendiceal                         orifice, and rectum were photographed. Findings:      The perianal and digital rectal examinations were normal. Pertinent       negatives include normal sphincter tone and no palpable rectal lesions.      Non-bleeding internal hemorrhoids were found during retroflexion. The       hemorrhoids were Grade I (internal hemorrhoids that do not prolapse).      Many medium-mouthed diverticula were found in the sigmoid colon.      A 3 mm polyp was found in the cecum. The polyp was sessile. The polyp       was removed with a jumbo cold forceps. Resection and retrieval were       complete.      The exam was otherwise without abnormality. Impression:            - Non-bleeding internal hemorrhoids.                        - Diverticulosis in the sigmoid colon.                        - One 3 mm polyp in the cecum, removed with a jumbo                         cold forceps. Resected and retrieved.                        -  The examination was otherwise normal. Recommendation:        - Patient has a contact number available for                         emergencies. The signs and symptoms of potential                         delayed complications were discussed with the patient.                         Return to normal activities tomorrow. Written                         discharge instructions were provided to the patient.                        - Resume previous diet.                        - Continue present  medications.                        - If polyps are benign or adenomatous without                         dysplasia, I will advise NO further colonoscopy due to                         advanced age and/or severe comorbidity.                        - Return to GI office PRN.                        - The findings and recommendations were discussed with                         the patient. Procedure Code(s):     --- Professional ---                        [phone], Colonoscopy, flexible; with biopsy, single or                         multiple Diagnosis Code(s):     --- Professional ---                        K57.30, Diverticulosis of large intestine without                         perforation or abscess without bleeding                        K63.5, Polyp of colon                        K64.0, First degree hemorrhoids                        Z86.010, Personal history of colonic polyps CPT copyright 2019 [employer]. All rights  reserved. The codes documented in this report are preliminary and upon coder review may  be revised to meet current compliance requirements. Efrain Sella MD, MD 05/30/2019 2:01:40 PM This report has been signed electronically. Number of Addenda: 0 Note Initiated On: 05/30/2019 1:22 PM Scope Withdrawal Time: 0 hours 7 minutes 52 seconds  Total Procedure Duration: 0 hours 16 minutes 19 seconds  Estimated Blood Loss:  Estimated blood loss: none.      [facility]

## 2019-05-30 NOTE — H&P (Signed)
Outpatient short stay form Pre-procedure 05/30/2019 1:23 PM Toran Murch K. Alice Reichert, M.D.  Primary Physician: Ellin Goodie, M.D.  Reason for visit:  Personal hx of colon polyps  History of present illness:                            Patient presents for colonoscopy for a personal hx of colon polyps - adenomatous. The patient denies abdominal pain, abnormal weight loss or rectal bleeding.      Current Facility-Administered Medications:  .  0.9 %  sodium chloride infusion, , Intravenous, Continuous, Tsaile, Benay Pike, MD, Last Rate: 20 mL/hr at 05/30/19 1315, New Bag at 05/30/19 1315  Medications Prior to Admission  Medication Sig Dispense Refill Last Dose  . Acetaminophen (TYLENOL ARTHRITIS PAIN PO) Take by mouth 2 (two) times daily.    05/30/2019 at Unknown time  . albuterol (VENTOLIN HFA) 108 (90 Base) MCG/ACT inhaler Inhale 1-2 puffs into the lungs as needed.    05/30/2019 at Unknown time  . aspirin 81 MG tablet Take 81 mg by mouth daily.   05/29/2019 at Unknown time  . cetirizine (ZYRTEC) 10 MG tablet Take 10 mg by mouth daily.   05/29/2019 at Unknown time  . Cholecalciferol (VITAMIN D PO) Take 1,000 Int'l Units by mouth daily.    05/29/2019 at Unknown time  . fluticasone (FLONASE) 50 MCG/ACT nasal spray Place 1 spray into the nose as needed.    05/29/2019 at Unknown time  . furosemide (LASIX) 40 MG tablet Take 0.5 tablets (20 mg total) by mouth daily.   05/29/2019 at Unknown time  . losartan (COZAAR) 100 MG tablet Take 100 mg by mouth daily.    05/30/2019 at 0700  . metolazone (ZAROXOLYN) 2.5 MG tablet Take 1 tablet (2.5 mg total) by mouth as directed. Take 1 tablet on Monday, Wednesday and Friday. 90 tablet 3 Past Week  . metoprolol succinate (TOPROL-XL) 100 MG 24 hr tablet Take 100 mg by mouth daily.    05/30/2019 at 0700  . omeprazole (PRILOSEC) 20 MG capsule Take 20 mg by mouth daily.   05/29/2019 at Unknown time  . potassium chloride SA (K-DUR) 20 MEQ tablet Take 1 tablet (20 mEq total) by mouth  daily. (Patient taking differently: Take 20 mEq by mouth daily. Taking QOD) 90 tablet 3 05/29/2019 at Unknown time  . HYDROcodone-Chlorpheniramine 5-4 MG/5ML SOLN Take 5 mLs by mouth at bedtime as needed.   Not Taking at Unknown time     Allergies  Allergen Reactions  . Penicillins Rash and Other (See Comments)  . Statins Other (See Comments)    Other reaction(s): Other (See Comments) myalgias Other reaction(s): Other (See Comments) myalgias myalgias   . Pravastatin Sodium     Other reaction(s): Other (See Comments) myalgias  . Sulfamethoxazole-Trimethoprim Other (See Comments)     Past Medical History:  Diagnosis Date  . (HFpEF) heart failure with preserved ejection fraction ([place])   . Allergic rhinitis   . CKD (chronic kidney disease) stage 3, GFR 30-59 ml/min   . Colon polyps   . COPD (chronic obstructive pulmonary disease) ([place])   . Hypertension   . Osteoarthritis    right knee  . Paroxysmal atrial fibrillation (HCC)   . Pulmonary fibrosis ([place])     Review of systems:  Otherwise negative.    Physical Exam  Gen: Alert, oriented. Appears stated age.  HEENT: [place]/AT. PERRLA. Lungs: CTA, no wheezes. CV: RR nl S1, S2. Abd:  soft, benign, no masses. BS+ Ext: No edema. Pulses 2+    Planned procedures: Proceed with colonoscopy. The patient understands the nature of the planned procedure, indications, risks, alternatives and potential complications including but not limited to bleeding, infection, perforation, damage to internal organs and possible oversedation/side effects from anesthesia. The patient agrees and gives consent to proceed.  Please refer to procedure notes for findings, recommendations and patient disposition/instructions.     Justun Anaya K. Norma Fredrickson, M.D. Gastroenterology 05/30/2019  1:23 PM

## 2019-05-30 NOTE — Anesthesia Preprocedure Evaluation (Signed)
Anesthesia Evaluation  Patient identified by MRN, date of birth, ID band Patient awake    Reviewed: Allergy & Precautions, NPO status , Patient's Chart, lab work & pertinent test results  History of Anesthesia Complications Negative for: history of anesthetic complications  Airway Mallampati: II  TM Distance: >3 FB Neck ROM: Full    Dental  (+) Partial Upper, Partial Lower, Teeth Intact Partial lowers are cemented in; partial uppers are very tightly placed and unable to easily remove:   Pulmonary shortness of breath and with exertion, sleep apnea , COPD, Patient abstained from smoking.Not current smoker, former smoker,  oSA not on cpap    breath sounds clear to auscultation + decreased breath sounds      Cardiovascular Exercise Tolerance: Poor METShypertension, Pt. on medications +CHF  (-) CAD and (-) Past MI (-) dysrhythmias + Valvular Problems/Murmurs  Rhythm:Regular Rate:Normal - Systolic murmurs Moderate-severe pulmonary hypertension TTE 2020: 1. The left ventricle has normal systolic function, with an ejection  fraction of 55-60%. The cavity size was normal. There is mildly increased  left ventricular wall thickness. Left ventricular diastolic Doppler  parameters are consistent with impaired  relaxation.  2. The right ventricle has mildly reduced systolic function. The cavity  was mildly enlarged. There is moderately increased right ventricular wall  thickness. Right ventricular systolic pressure is severely elevated with  an estimated pressure of 62.9  mmHg.  3. Left atrial size was mildly dilated.  4. Right atrial size was mildly dilated.  5. Tricuspid valve regurgitation is mild-moderate.    Neuro/Psych negative neurological ROS  negative psych ROS   GI/Hepatic neg GERD  ,(+)     (-) substance abuse  ,   Endo/Other  neg diabetes  Renal/GU CRFRenal disease     Musculoskeletal   Abdominal (+) + obese,    Peds  Hematology   Anesthesia Other Findings Past Medical History: No date: (HFpEF) heart failure with preserved ejection fraction (HCC) No date: Allergic rhinitis No date: CKD (chronic kidney disease) stage 3, GFR 30-59 ml/min No date: Colon polyps No date: COPD (chronic obstructive pulmonary disease) (HCC) No date: Hypertension No date: Osteoarthritis     Comment:  right knee No date: Paroxysmal atrial fibrillation (HCC) No date: Pulmonary fibrosis (HCC)  Reproductive/Obstetrics                             Anesthesia Physical Anesthesia Plan  ASA: III  Anesthesia Plan: General   Post-op Pain Management:    Induction: Intravenous  PONV Risk Score and Plan: 3 and Ondansetron, Propofol infusion and TIVA  Airway Management Planned: Nasal CPAP  Additional Equipment: None  Intra-op Plan:   Post-operative Plan:   Informed Consent: I have reviewed the patients History and Physical, chart, labs and discussed the procedure including the risks, benefits and alternatives for the proposed anesthesia with the patient or authorized representative who has indicated his/her understanding and acceptance.     Dental advisory given  Plan Discussed with: CRNA and Surgeon  Anesthesia Plan Comments: (Discussed risks of anesthesia with patient, including possibility of difficulty with spontaneous ventilation under anesthesia necessitating airway intervention, PONV, and rare risks such as cardiac or respiratory or neurological events. Patient understands. Patient counseled on being high risk for anesthetic. Patient was told about increased risk of cardiac and respiratory events, including death. Patient understands.  )        Anesthesia Quick Evaluation

## 2019-05-30 NOTE — Anesthesia Postprocedure Evaluation (Signed)
Anesthesia Post Note  Patient: Holly Townsend  Procedure(s) Performed: COLONOSCOPY WITH PROPOFOL (N/A )  Patient location during evaluation: Endoscopy Anesthesia Type: General Level of consciousness: awake and alert Pain management: pain level controlled Vital Signs Assessment: post-procedure vital signs reviewed and stable Respiratory status: spontaneous breathing, nonlabored ventilation, respiratory function stable and patient connected to nasal cannula oxygen Cardiovascular status: blood pressure returned to baseline and stable Postop Assessment: no apparent nausea or vomiting Anesthetic complications: no     Last Vitals:  Vitals:   05/30/19 1254 05/30/19 1408  BP: 128/73 101/64  Pulse: 63   Resp: 18   Temp: (!) 36.4 C (!) 36.2 C  SpO2: 97%     Last Pain:  Vitals:   05/30/19 1428  TempSrc:   PainSc: 0-No pain                 Corinda Gubler

## 2019-05-31 ENCOUNTER — Encounter: Payer: Self-pay | Admitting: *Deleted

## 2019-06-04 LAB — SURGICAL PATHOLOGY

## 2019-06-12 LAB — COLOGUARD (EXT): COLOGUARD (EXT): NEGATIVE

## 2019-08-17 ENCOUNTER — Ambulatory Visit: Payer: Medicare HMO | Admitting: Cardiovascular Disease

## 2019-08-21 ENCOUNTER — Ambulatory Visit: Payer: Medicare HMO | Admitting: Cardiovascular Disease

## 2019-08-21 ENCOUNTER — Encounter: Payer: Self-pay | Admitting: Cardiovascular Disease

## 2019-08-21 ENCOUNTER — Other Ambulatory Visit: Payer: Self-pay

## 2019-08-21 VITALS — BP 116/66 | HR 57 | Ht 69.0 in | Wt 290.1 lb

## 2019-08-21 DIAGNOSIS — I5032 Chronic diastolic (congestive) heart failure: Secondary | ICD-10-CM | POA: Diagnosis not present

## 2019-08-21 DIAGNOSIS — I272 Pulmonary hypertension, unspecified: Secondary | ICD-10-CM

## 2019-08-21 DIAGNOSIS — I480 Paroxysmal atrial fibrillation: Secondary | ICD-10-CM

## 2019-08-21 DIAGNOSIS — I10 Essential (primary) hypertension: Secondary | ICD-10-CM | POA: Diagnosis not present

## 2019-08-21 NOTE — Patient Instructions (Signed)
Medication Instructions:  Your physician recommends that you continue on your current medications as directed. Please refer to the Current Medication list given to you today.  *If you need a refill on your cardiac medications before your next appointment, please call your pharmacy*   Lab Work: None ordered If you have labs (blood work) drawn today and your tests are completely normal, you will receive your results only by: . MyChart Message (if you have MyChart) OR . A paper copy in the mail If you have any lab test that is abnormal or we need to change your treatment, we will call you to review the results.   Testing/Procedures: Your physician has requested that you have an echocardiogram. Echocardiography is a painless test that uses sound waves to create images of your heart. It provides your doctor with information about the size and shape of your heart and how well your heart's chambers and valves are working. This procedure takes approximately one hour. There are no restrictions for this procedure.     Follow-Up: At [facility], you and your health needs are our priority.  As part of our continuing mission to provide you with exceptional heart care, we have created designated Provider Care Teams.  These Care Teams include your primary Cardiologist (physician) and Advanced Practice Providers (APPs -  Physician Assistants and Nurse Practitioners) who all work together to provide you with the care you need, when you need it.  We recommend signing up for the patient portal called "MyChart".  Sign up information is provided on this After Visit Summary.  MyChart is used to connect with patients for Virtual Visits (Telemedicine).  Patients are able to view lab/test results, encounter notes, upcoming appointments, etc.  Non-urgent messages can be sent to your provider as well.   To learn more about what you can do with MyChart, go to [email or web address].    Your next appointment:   6  month(s)  The format for your next appointment:   In Person  Provider:    You may see Muhammad Arida, MD or one of the following Advanced Practice Providers on your designated Care Team:    Christopher Berge, NP  [name] Dunn, PA-C  Jacquelyn Visser, PA-C    Other Instructions N/A  

## 2019-08-21 NOTE — Progress Notes (Signed)
Cardiology Office Note   Date:  08/21/2019   ID:  Holly Townsend, DOB 18-Aug-1945, MRN [id number]  PCP:  Ellamae Sia, MD  Cardiologist:   Kathlyn Sacramento, MD   Chief Complaint  Patient presents with  . office visit    Pt concern w/ acid reflux. Meds verbally reviewed w/ pt.       History of Present Illness: Holly Townsend is a 74 y.o. female who is here today for follow-up visit regarding chronic diastolic heart failure with pulmonary hypertension.  She has known history of paroxysmal atrial fibrillation, sleep apnea, obesity, stage III chronic kidney disease, essential hypertension and previous tobacco use. She was diagnosed with atrial fibrillation in 2013 in the setting of use of decongestants.  She converted spontaneously to sinus rhythm.  No recurrent atrial fibrillation since then and thus she has not been anticoagulated due to low burden.  She was seen last year for worsening exertional dyspnea and leg edema. She had a CT scan for lung cancer screening in 2019 which showed no evidence of coronary atherosclerosis.  However, it did show evidence of cardiomegaly and enlarged pulmonary artery.  There was evidence of emphysema on CT scan. Echocardiogram in March 2020 showed normal LV systolic function with moderate to severe pulmonary hypertension with estimated systolic pulmonary pressure of 63 mmHg.  There was mild to moderate tricuspid regurgitation.  Lexiscan Myoview showed no evidence of ischemia.  She improved with diuresis and reports that her symptoms overall has been stable.  She has dyspnea with overexertion.  She did have worsening symptoms of GERD recently but improved after she was placed on Nexium.  She continues to take furosemide daily and metolazone 3 times a week with stable weight.     Past Medical History:  Diagnosis Date  . (HFpEF) heart failure with preserved ejection fraction ([place])   . Allergic rhinitis   . CKD (chronic kidney disease) stage 3,  GFR 30-59 ml/min   . Colon polyps   . COPD (chronic obstructive pulmonary disease) ([place])   . Hypertension   . Osteoarthritis    right knee  . Paroxysmal atrial fibrillation ([facility])   . Pulmonary fibrosis ([place])     Past Surgical History:  Procedure Laterality Date  . BREAST EXCISIONAL BIOPSY Left 1965   NEG  . CATARACT EXTRACTION, BILATERAL    . COLONOSCOPY WITH PROPOFOL N/A 05/30/2019   Procedure: COLONOSCOPY WITH PROPOFOL;  Surgeon: Toledo, Benay Pike, MD;  Location: [facility] ENDOSCOPY;  Service: Gastroenterology;  Laterality: N/A;  . EYE SURGERY  2017   detached retina  . VAGINAL HYSTERECTOMY       Current Outpatient Medications  Medication Sig Dispense Refill  . Acetaminophen (TYLENOL ARTHRITIS PAIN PO) Take by mouth 2 (two) times daily.     Marland Kitchen albuterol (VENTOLIN HFA) 108 (90 Base) MCG/ACT inhaler Inhale 1-2 puffs into the lungs as needed.     Marland Kitchen aspirin 81 MG tablet Take 81 mg by mouth daily.    . cetirizine (ZYRTEC) 10 MG tablet Take 10 mg by mouth daily.    . Cholecalciferol (VITAMIN D PO) Take 1,000 Int'l Units by mouth daily.     Marland Kitchen esomeprazole (NEXIUM) 40 MG capsule Take 40 mg by mouth daily.    . fluticasone (FLONASE) 50 MCG/ACT nasal spray Place 1 spray into the nose as needed.     . furosemide (LASIX) 40 MG tablet Take 0.5 tablets (20 mg total) by mouth daily.    Marland Kitchen HYDROcodone-Chlorpheniramine  5-4 MG/5ML SOLN Take 5 mLs by mouth at bedtime as needed.    Marland Kitchen losartan (COZAAR) 100 MG tablet Take 100 mg by mouth daily.     . metolazone (ZAROXOLYN) 2.5 MG tablet Take 1 tablet (2.5 mg total) by mouth as directed. Take 1 tablet on Monday, Wednesday and Friday. 90 tablet 3  . metoprolol succinate (TOPROL-XL) 100 MG 24 hr tablet Take 100 mg by mouth daily.     Marland Kitchen omeprazole (PRILOSEC) 20 MG capsule Take 20 mg by mouth daily.    . potassium chloride SA (K-DUR) 20 MEQ tablet Take 1 tablet (20 mEq total) by mouth daily. (Patient taking differently: Take 20 mEq by mouth daily. Taking QOD)  90 tablet 3   No current facility-administered medications for this visit.    Allergies:   Penicillins, Statins, Pravastatin sodium, and Sulfamethoxazole-trimethoprim    Social History:  The patient  reports that she quit smoking about 12 years ago. Her smoking use included cigarettes. She has a 70.50 pack-year smoking history. She has never used smokeless tobacco. She reports that she does not drink alcohol or use drugs.   Family History:  The patient's family history includes Kidney failure in her father; Liver cancer in her mother.    ROS:  Please see the history of present illness.   Otherwise, review of systems are positive for none.   All other systems are reviewed and negative.    PHYSICAL EXAM: VS:  BP 116/66 (BP Location: Left Arm, Patient Position: Sitting, Cuff Size: Normal)   Pulse (!) 57   Ht 5\' 9"  (1.753 m)   Wt 290 lb 2 oz (131.6 kg)   SpO2 94%   BMI 42.84 kg/m  , BMI Body mass index is 42.84 kg/m. GEN: Well nourished, well developed, in no acute distress  HEENT: normal  Neck: Jugular venous pressure is not well visualized, carotid bruits, or masses Cardiac: RRR; no murmurs, rubs, or gallops, mild  bilateral leg edema Respiratory:  clear to auscultation bilaterally, normal work of breathing GI: soft, nontender, nondistended, + BS MS: no deformity or atrophy  Skin: warm and dry, no rash Neuro:  Strength and sensation are intact Psych: euthymic mood, full affect   EKG:  EKG is ordered today. The ekg ordered today demonstrates sinus bradycardia with sinus arrhythmia and first-degree AV block.   Recent Labs: 09/04/2018: BUN 43; Creatinine, Ser 2.11; Potassium 3.8; Sodium 139    Lipid Panel No results found for: CHOL, TRIG, HDL, CHOLHDL, VLDL, LDLCALC, LDLDIRECT    Wt Readings from Last 3 Encounters:  08/21/19 290 lb 2 oz (131.6 kg)  05/30/19 290 lb (131.5 kg)  03/30/19 290 lb (131.5 kg)       PAD Screen 05/18/2018  Previous PAD dx? No  Previous  surgical procedure? No  Pain with walking? No  Feet/toe relief with dangling? No  Painful, non-healing ulcers? No  Extremities discolored? No      ASSESSMENT AND PLAN:  1.  Chronic diastolic heart failure with moderate severe pulmonary hypertension: She seems to be stable and euvolemic on current diuretic dose.  Renal function has been stable with creatinine around 2 followed by nephrology at [facility].  Given significant pulmonary hypertension noted last year, I am going to repeat her echocardiogram to ensure improvement.  Otherwise a right heart catheterization might be needed.  3.  Essential hypertension: Blood pressure is well controlled on current medications  3.  Chronic kidney disease: Followed by [facility] nephrology.  4.  Previous  episode of atrial fibrillation in the setting of decongestant use with no recurrent episodes.  No need for anticoagulation unless she develops recurrent episodes.  5.  GERD: Improved with Nexium.  Stress test last year was unremarkable and current EKG does not show any ischemic changes.    Disposition:   FU with me in 6 months  Signed,  Lorine Bears, MD  08/21/2019 2:32 PM    [facility]

## 2019-09-10 ENCOUNTER — Other Ambulatory Visit: Payer: Self-pay | Admitting: Internal Medicine

## 2019-09-10 DIAGNOSIS — Z1231 Encounter for screening mammogram for malignant neoplasm of breast: Secondary | ICD-10-CM

## 2019-09-28 ENCOUNTER — Other Ambulatory Visit: Payer: Self-pay

## 2019-09-28 ENCOUNTER — Ambulatory Visit (INDEPENDENT_AMBULATORY_CARE_PROVIDER_SITE_OTHER): Payer: Medicare HMO

## 2019-09-28 DIAGNOSIS — I5032 Chronic diastolic (congestive) heart failure: Secondary | ICD-10-CM | POA: Diagnosis not present

## 2019-10-03 ENCOUNTER — Telehealth: Payer: Self-pay | Admitting: Cardiovascular Disease

## 2019-10-03 NOTE — Telephone Encounter (Signed)
Spoke with patient and reviewed that results are still pending provider review. I did review preliminary finding of 60-65% but that we would wait for him to review the complete report. She was appreciative for the call back and the understanding that we need to wait for him to review. She verbalized understanding of our conversation with no further questions at this time.

## 2019-10-03 NOTE — Telephone Encounter (Signed)
Patient would like echocardiogram results.  

## 2019-10-04 ENCOUNTER — Telehealth: Payer: Self-pay

## 2019-10-04 NOTE — Telephone Encounter (Signed)
-----   Message from Iran Ouch, MD sent at 10/04/2019  9:17 AM EDT ----- Inform patient that echo was fine.  Normal ejection fraction with improvement in pulmonary hypertension.  Continue same medications.

## 2019-10-04 NOTE — Telephone Encounter (Signed)
Call to patient to review echo results.    Pt verbalized understanding and has no further questions at this time.    Advised pt to call for any further questions or concerns.  No further orders.   

## 2019-10-22 ENCOUNTER — Ambulatory Visit
Admission: RE | Admit: 2019-10-22 | Discharge: 2019-10-22 | Disposition: A | Discharge status: Home / Self Care | Payer: Medicare HMO | Source: Ambulatory Visit | Attending: Internal Medicine | Admitting: Internal Medicine

## 2019-10-22 DIAGNOSIS — Z1231 Encounter for screening mammogram for malignant neoplasm of breast: Secondary | ICD-10-CM | POA: Insufficient documentation

## 2019-10-23 ENCOUNTER — Telehealth: Payer: Self-pay

## 2019-10-23 NOTE — Telephone Encounter (Signed)
Unsuccessful attempt at calling patient, no voicemail available, to notify them that it is time to schedule the low dose lung cancer screening CT scan.  

## 2019-10-29 ENCOUNTER — Telehealth: Payer: Self-pay

## 2019-10-29 DIAGNOSIS — Z87891 Personal history of nicotine dependence: Secondary | ICD-10-CM

## 2019-10-29 DIAGNOSIS — Z122 Encounter for screening for malignant neoplasm of respiratory organs: Secondary | ICD-10-CM

## 2019-10-29 NOTE — Telephone Encounter (Signed)
Message left notifying patient that it is time to schedule the low dose lung cancer screening CT scan.  Instructed patient to return call to Shawn Perkins at [phone] to verify information prior to CT scan being scheduled.    

## 2019-10-30 NOTE — Telephone Encounter (Signed)
Contacted and scheduled 

## 2019-10-30 NOTE — Addendum Note (Signed)
Addended by: Jonne Ply on: 10/30/2019 09:43 AM   Modules accepted: Orders

## 2019-11-15 ENCOUNTER — Other Ambulatory Visit: Payer: Self-pay

## 2019-11-15 ENCOUNTER — Ambulatory Visit
Admission: RE | Admit: 2019-11-15 | Discharge: 2019-11-15 | Disposition: A | Discharge status: Home / Self Care | Payer: Medicare HMO | Source: Ambulatory Visit | Attending: Nurse Practitioner | Admitting: Nurse Practitioner

## 2019-11-15 DIAGNOSIS — Z87891 Personal history of nicotine dependence: Secondary | ICD-10-CM | POA: Insufficient documentation

## 2019-11-15 DIAGNOSIS — Z122 Encounter for screening for malignant neoplasm of respiratory organs: Secondary | ICD-10-CM | POA: Diagnosis present

## 2019-11-17 ENCOUNTER — Encounter: Payer: Self-pay | Admitting: *Deleted

## 2019-12-03 ENCOUNTER — Ambulatory Visit: Payer: Medicare HMO | Attending: Internal Medicine

## 2019-12-03 ENCOUNTER — Ambulatory Visit: Payer: Self-pay

## 2019-12-03 DIAGNOSIS — Z23 Encounter for immunization: Secondary | ICD-10-CM

## 2019-12-03 NOTE — Progress Notes (Signed)
   Covid-19 Vaccination Clinic  Name:  Holly Townsend    MRN: [id number] DOB: June 05, 1945  12/03/2019  Holly Townsend was observed post Covid-19 immunization for 30 minutes based on pre-vaccination screening without incident. She was provided with Vaccine Information Sheet and instruction to access the V-Safe system.   Holly Townsend was instructed to call 911 with any severe reactions post vaccine: Marland Kitchen Difficulty breathing  . Swelling of face and throat  . A fast heartbeat  . A bad rash all over body  . Dizziness and weakness

## 2020-02-28 ENCOUNTER — Encounter: Payer: Self-pay | Admitting: Cardiovascular Disease

## 2020-02-28 ENCOUNTER — Ambulatory Visit: Payer: Medicare HMO | Admitting: Cardiovascular Disease

## 2020-02-28 ENCOUNTER — Other Ambulatory Visit: Payer: Self-pay

## 2020-02-28 VITALS — BP 110/70 | HR 59 | Ht 69.0 in | Wt 292.2 lb

## 2020-02-28 DIAGNOSIS — I5032 Chronic diastolic (congestive) heart failure: Secondary | ICD-10-CM | POA: Diagnosis not present

## 2020-02-28 DIAGNOSIS — N183 Chronic kidney disease, stage 3 unspecified: Secondary | ICD-10-CM

## 2020-02-28 DIAGNOSIS — I10 Essential (primary) hypertension: Secondary | ICD-10-CM | POA: Diagnosis not present

## 2020-02-28 NOTE — Progress Notes (Signed)
Cardiology Office Note   Date:  02/28/2020   ID:  Holly Townsend, DOB 1945-05-10, MRN [id number]  PCP:  Hyman Hopes, MD  Cardiologist:   Lorine Bears, MD   Chief Complaint  Patient presents with  . Other    6 month f/u no complaints today. Meds reviewed verbally with pt.      History of Present Illness: Holly Townsend is a 74 y.o. female who is here today for follow-up visit regarding chronic diastolic heart failure with pulmonary hypertension.  She has known history of paroxysmal atrial fibrillation, sleep apnea, obesity, stage III chronic kidney disease, essential hypertension and previous tobacco use. She was diagnosed with atrial fibrillation in 2013 in the setting of use of decongestants.  She converted spontaneously to sinus rhythm.  No recurrent atrial fibrillation since then and thus she has not been anticoagulated due to low burden.  CT scan of the lungs for cancer screening in 2019 showed no evidence of coronary atherosclerosis.  However, it did show evidence of cardiomegaly and enlarged pulmonary artery.  There was evidence of emphysema on CT scan. Echocardiogram in March 2020 showed normal LV systolic function with moderate to severe pulmonary hypertension with estimated systolic pulmonary pressure of 63 mmHg.  There was mild to moderate tricuspid regurgitation.  Lexiscan Myoview showed no evidence of ischemia.  She improved with diuresis.  Repeat echocardiogram in July 2021 showed normal LV systolic function with mild to moderate pulmonary hypertension.  Peak systolic pulmonary pressure was 48 mmHg.  She reports stable exertional dyspnea with no chest pain or leg edema.  She takes furosemide 40 mg daily and metolazone 3 times weekly.    Past Medical History:  Diagnosis Date  . (HFpEF) heart failure with preserved ejection fraction ([facility])   . Allergic rhinitis   . CKD (chronic kidney disease) stage 3, GFR 30-59 ml/min ([facility])   . Colon polyps   . COPD  (chronic obstructive pulmonary disease) ([facility])   . Hypertension   . Osteoarthritis    right knee  . Paroxysmal atrial fibrillation ([facility])   . Pulmonary fibrosis ([facility])     Past Surgical History:  Procedure Laterality Date  . BREAST EXCISIONAL BIOPSY Left 1965   NEG  . CATARACT EXTRACTION, BILATERAL    . COLONOSCOPY WITH PROPOFOL N/A 05/30/2019   Procedure: COLONOSCOPY WITH PROPOFOL;  Surgeon: Toledo, Boykin Nearing, MD;  Location: [facility] ENDOSCOPY;  Service: Gastroenterology;  Laterality: N/A;  . EYE SURGERY  2017   detached retina  . VAGINAL HYSTERECTOMY       Current Outpatient Medications  Medication Sig Dispense Refill  . Acetaminophen (TYLENOL ARTHRITIS PAIN PO) Take by mouth 2 (two) times daily.     Marland Kitchen albuterol (VENTOLIN HFA) 108 (90 Base) MCG/ACT inhaler Inhale 1-2 puffs into the lungs as needed.     Marland Kitchen aspirin 81 MG tablet Take 81 mg by mouth daily.    . cetirizine (ZYRTEC) 10 MG tablet Take 10 mg by mouth daily.    . Cholecalciferol (VITAMIN D PO) Take 1,000 Int'l Units by mouth daily.     Marland Kitchen esomeprazole (NEXIUM) 40 MG capsule Take 40 mg by mouth daily.    . fluticasone (FLONASE) 50 MCG/ACT nasal spray Place 1 spray into the nose as needed.     . furosemide (LASIX) 40 MG tablet Take 0.5 tablets (20 mg total) by mouth daily.    Marland Kitchen HYDROcodone-Chlorpheniramine 5-4 MG/5ML SOLN Take 5 mLs by mouth at bedtime as needed.    Marland Kitchen  losartan (COZAAR) 100 MG tablet Take 100 mg by mouth daily.     . metolazone (ZAROXOLYN) 2.5 MG tablet Take 1 tablet (2.5 mg total) by mouth as directed. Take 1 tablet on Monday, Wednesday and Friday. 90 tablet 3  . metoprolol succinate (TOPROL-XL) 100 MG 24 hr tablet Take 100 mg by mouth daily.     Marland Kitchen omeprazole (PRILOSEC) 20 MG capsule Take 20 mg by mouth daily.    . potassium chloride SA (K-DUR) 20 MEQ tablet Take 1 tablet (20 mEq total) by mouth daily. (Patient taking differently: Take 20 mEq by mouth daily. Taking QOD) 90 tablet 3   No current  facility-administered medications for this visit.    Allergies:   Penicillins, Statins, Pravastatin sodium, and Sulfamethoxazole-trimethoprim    Social History:  The patient  reports that she quit smoking about 12 years ago. Her smoking use included cigarettes. She has a 70.50 pack-year smoking history. She has never used smokeless tobacco. She reports that she does not drink alcohol and does not use drugs.   Family History:  The patient's family history includes Kidney failure in her father; Liver cancer in her mother.    ROS:  Please see the history of present illness.   Otherwise, review of systems are positive for none.   All other systems are reviewed and negative.    PHYSICAL EXAM: VS:  BP 110/70 (BP Location: Left Arm, Patient Position: Sitting, Cuff Size: Large)   Pulse (!) 59   Ht 5\' 9"  (1.753 m)   Wt 292 lb 4 oz (132.6 kg)   SpO2 95%   BMI 43.16 kg/m  , BMI Body mass index is 43.16 kg/m. GEN: Well nourished, well developed, in no acute distress  HEENT: normal  Neck: Jugular venous pressure is not well visualized, carotid bruits, or masses Cardiac: RRR; no murmurs, rubs, or gallops, mild  bilateral leg edema Respiratory:  clear to auscultation bilaterally, normal work of breathing GI: soft, nontender, nondistended, + BS MS: no deformity or atrophy  Skin: warm and dry, no rash Neuro:  Strength and sensation are intact Psych: euthymic mood, full affect   EKG:  EKG is ordered today. The ekg ordered today demonstrates sinus bradycardia with sinus arrhythmia and first-degree AV block.   Recent Labs: No results found for requested labs within last 8760 hours.    Lipid Panel No results found for: CHOL, TRIG, HDL, CHOLHDL, VLDL, LDLCALC, LDLDIRECT    Wt Readings from Last 3 Encounters:  02/28/20 292 lb 4 oz (132.6 kg)  11/15/19 290 lb (131.5 kg)  08/21/19 290 lb 2 oz (131.6 kg)       PAD Screen 05/18/2018  Previous PAD dx? No  Previous surgical procedure? No   Pain with walking? No  Feet/toe relief with dangling? No  Painful, non-healing ulcers? No  Extremities discolored? No      ASSESSMENT AND PLAN:  1.  Chronic diastolic heart failure with pulmonary hypertension: She seems to be stable and euvolemic on current diuretic dose.  Renal function has been stable with creatinine around 2 followed by nephrology at [facility].  Repeat echocardiogram in July of this year showed improvement in pulmonary hypertension.  Continue furosemide 40 mg daily and metolazone 2.5 mg 3 times weekly.  Recheck labs today.  3.  Essential hypertension: Blood pressure is well controlled on current medications  3.  Chronic kidney disease: Followed by [facility] nephrology.  4.  Previous episode of atrial fibrillation in the setting of decongestant use with  no recurrent episodes.  No need for anticoagulation unless she develops recurrent episodes.  5.  GERD: Improved with Nexium.  Cardiac work-up has been negative so far.    Disposition:   FU with me in 6 months  Signed,  Lorine Bears, MD  02/28/2020 11:31 AM    [facility]

## 2020-02-28 NOTE — Patient Instructions (Signed)
Medication Instructions:  Your physician recommends that you continue on your current medications as directed. Please refer to the Current Medication list given to you today.  *If you need a refill on your cardiac medications before your next appointment, please call your pharmacy*   Lab Work: Bmet and Cbc If you have labs (blood work) drawn today and your tests are completely normal, you will receive your results only by: Marland Kitchen MyChart Message (if you have MyChart) OR . A paper copy in the mail If you have any lab test that is abnormal or we need to change your treatment, we will call you to review the results.   Testing/Procedures: None ordered   Follow-Up: At [facility], you and your health needs are our priority.  As part of our continuing mission to provide you with exceptional heart care, we have created designated Provider Care Teams.  These Care Teams include your primary Cardiologist (physician) and Advanced Practice Providers (APPs -  Physician Assistants and Nurse Practitioners) who all work together to provide you with the care you need, when you need it.  We recommend signing up for the patient portal called "MyChart".  Sign up information is provided on this After Visit Summary.  MyChart is used to connect with patients for Virtual Visits (Telemedicine).  Patients are able to view lab/test results, encounter notes, upcoming appointments, etc.  Non-urgent messages can be sent to your provider as well.   To learn more about what you can do with MyChart, go to [email or web address].    Your next appointment:   6 month(s)  The format for your next appointment:   In Person  Provider:   You may see Lorine Bears, MD or one of the following Advanced Practice Providers on your designated Care Team:    Nicolasa Ducking, NP  Eula Listen, PA-C  Marisue Ivan, PA-C  Cadence Jacinto, [place]  Gillian Shields, NP    Other Instructions N/A

## 2020-02-29 LAB — CBC WITH DIFFERENTIAL/PLATELET
Basophils Absolute: 0 10*3/uL (ref 0.0–0.2)
Basos: 0 %
EOS (ABSOLUTE): 0.2 10*3/uL (ref 0.0–0.4)
Eos: 2 %
Hematocrit: 40.5 % (ref 34.0–46.6)
Hemoglobin: 13.3 g/dL (ref 11.1–15.9)
Immature Grans (Abs): 0 10*3/uL (ref 0.0–0.1)
Immature Granulocytes: 0 %
Lymphocytes Absolute: 1.5 10*3/uL (ref 0.7–3.1)
Lymphs: 18 %
MCH: 25.9 pg — ABNORMAL LOW (ref 26.6–33.0)
MCHC: 32.8 g/dL (ref 31.5–35.7)
MCV: 79 fL (ref 79–97)
Monocytes Absolute: 0.7 10*3/uL (ref 0.1–0.9)
Monocytes: 8 %
Neutrophils Absolute: 5.9 10*3/uL (ref 1.4–7.0)
Neutrophils: 72 %
Platelets: 207 10*3/uL (ref 150–450)
RBC: 5.13 x10E6/uL (ref 3.77–5.28)
RDW: 15.1 % (ref 11.7–15.4)
WBC: 8.3 10*3/uL (ref 3.4–10.8)

## 2020-02-29 LAB — BASIC METABOLIC PANEL
BUN/Creatinine Ratio: 22 (ref 12–28)
BUN: 50 mg/dL — ABNORMAL HIGH (ref 8–27)
CO2: 22 mmol/L (ref 20–29)
Calcium: 9.8 mg/dL (ref 8.7–10.3)
Chloride: 99 mmol/L (ref 96–106)
Creatinine, Ser: 2.27 mg/dL — ABNORMAL HIGH (ref 0.57–1.00)
GFR calc Af Amer: 24 mL/min/{1.73_m2} — ABNORMAL LOW (ref >59)
GFR calc non Af Amer: 21 mL/min/{1.73_m2} — ABNORMAL LOW (ref >59)
Glucose: 103 mg/dL — ABNORMAL HIGH (ref 65–99)
Potassium: 4.1 mmol/L (ref 3.5–5.2)
Sodium: 139 mmol/L (ref 134–144)

## 2020-06-04 LAB — HEPATITIS C ANTIBODY (EXT): HEPATITIS C ANTIBODY (EXT): NEGATIVE

## 2020-09-04 ENCOUNTER — Ambulatory Visit (INDEPENDENT_AMBULATORY_CARE_PROVIDER_SITE_OTHER): Payer: Medicare HMO | Admitting: Cardiovascular Disease

## 2020-09-04 ENCOUNTER — Other Ambulatory Visit: Payer: Self-pay

## 2020-09-04 ENCOUNTER — Encounter: Payer: Self-pay | Admitting: Cardiovascular Disease

## 2020-09-04 ENCOUNTER — Other Ambulatory Visit: Payer: Self-pay | Admitting: *Deleted

## 2020-09-04 VITALS — BP 134/60 | HR 65 | Ht 69.0 in | Wt 283.5 lb

## 2020-09-04 DIAGNOSIS — I272 Pulmonary hypertension, unspecified: Secondary | ICD-10-CM

## 2020-09-04 DIAGNOSIS — I10 Essential (primary) hypertension: Secondary | ICD-10-CM | POA: Diagnosis not present

## 2020-09-04 DIAGNOSIS — I5032 Chronic diastolic (congestive) heart failure: Secondary | ICD-10-CM

## 2020-09-04 DIAGNOSIS — I480 Paroxysmal atrial fibrillation: Secondary | ICD-10-CM

## 2020-09-04 NOTE — Progress Notes (Signed)
Cardiology Office Note   Date:  09/04/2020   ID:  Holly Townsend, Holly Townsend 1945-09-07, MRN [id number]  PCP:  [facility]  Cardiologist:   Lorine Bears, MD   Chief Complaint  Patient presents with   Other    6 month f/u no complaints today. Meds reviewed verbally with pt.       History of Present Illness: Holly Townsend is a 75 y.o. female who is here today for follow-up visit regarding chronic diastolic heart failure with pulmonary hypertension. She has known history of paroxysmal atrial fibrillation, sleep apnea, obesity, stage III chronic kidney disease, essential hypertension and previous tobacco use. She was diagnosed with atrial fibrillation in 2013 in the setting of decongestants use.  She converted spontaneously to sinus rhythm.  No recurrent atrial fibrillation since then and thus she has not been anticoagulated due to low burden.  CT scan of the lungs for cancer screening in 2019 showed no evidence of coronary atherosclerosis.  However, it did show evidence of cardiomegaly and enlarged pulmonary artery.  There was evidence of emphysema on CT scan. Echocardiogram in March 2020 showed normal LV systolic function with moderate to severe pulmonary hypertension with estimated systolic pulmonary pressure of 63 mmHg.  There was mild to moderate tricuspid regurgitation.  Lexiscan Myoview showed no evidence of ischemia.  She improved with diuresis.  Repeat echocardiogram in July 2021 showed normal LV systolic function with mild to moderate pulmonary hypertension.  Peak systolic pulmonary pressure was 48 mmHg.  She has been stable overall with no recent chest pain.Marland Kitchen  Describes stable exertional dyspnea.  She has mild bilateral leg edema.  Metolazone was decreased from 3 times per week to 2 times per week by nephrology.  Renal function is stable overall.    Past Medical History:  Diagnosis Date   (HFpEF) heart failure with preserved ejection fraction  (HCC)    Allergic rhinitis    CKD (chronic kidney disease) stage 3, GFR 30-59 ml/min (HCC)    Colon polyps    COPD (chronic obstructive pulmonary disease) (HCC)    Hypertension    Osteoarthritis    right knee   Paroxysmal atrial fibrillation (HCC)    Pulmonary fibrosis (HCC)     Past Surgical History:  Procedure Laterality Date   BREAST EXCISIONAL BIOPSY Left 1965   NEG   CATARACT EXTRACTION, BILATERAL     COLONOSCOPY WITH PROPOFOL N/A 05/30/2019   Procedure: COLONOSCOPY WITH PROPOFOL;  Surgeon: Toledo, Boykin Nearing, MD;  Location: [facility] ENDOSCOPY;  Service: Gastroenterology;  Laterality: N/A;   EYE SURGERY  2017   detached retina   VAGINAL HYSTERECTOMY       Current Outpatient Medications  Medication Sig Dispense Refill   Acetaminophen (TYLENOL ARTHRITIS PAIN PO) Take by mouth 2 (two) times daily.      albuterol (VENTOLIN HFA) 108 (90 Base) MCG/ACT inhaler Inhale 1-2 puffs into the lungs as needed.      aspirin 81 MG tablet Take 81 mg by mouth daily.     cetirizine (ZYRTEC) 10 MG tablet Take 10 mg by mouth daily.     Cholecalciferol (VITAMIN D PO) Take 1,000 Int'l Units by mouth daily.      esomeprazole (NEXIUM) 40 MG capsule Take 40 mg by mouth daily.     fluticasone (FLONASE) 50 MCG/ACT nasal spray Place 1 spray into the nose as needed.      furosemide (LASIX) 40 MG tablet Take 40 mg by mouth daily.  HYDROcodone-Chlorpheniramine 5-4 MG/5ML SOLN Take 5 mLs by mouth at bedtime as needed.     losartan (COZAAR) 100 MG tablet Take 100 mg by mouth daily.      metolazone (ZAROXOLYN) 2.5 MG tablet Take 2.5 mg by mouth 2 (two) times a week. Monday and Friday.     metoprolol succinate (TOPROL-XL) 100 MG 24 hr tablet Take 100 mg by mouth daily.      omeprazole (PRILOSEC) 20 MG capsule Take 20 mg by mouth daily.     potassium chloride SA (K-DUR) 20 MEQ tablet Take 1 tablet (20 mEq total) by mouth daily. 90 tablet 3   No current facility-administered medications for this visit.     Allergies:   Penicillins, Statins, Pravastatin sodium, and Sulfamethoxazole-trimethoprim    Social History:  The patient  reports that she quit smoking about 13 years ago. Her smoking use included cigarettes. She has a 70.50 pack-year smoking history. She has never used smokeless tobacco. She reports that she does not drink alcohol and does not use drugs.   Family History:  The patient's family history includes Kidney failure in her father; Liver cancer in her mother.    ROS:  Please see the history of present illness.   Otherwise, review of systems are positive for none.   All other systems are reviewed and negative.    PHYSICAL EXAM: VS:  BP 134/60 (BP Location: Left Arm, Patient Position: Sitting, Cuff Size: Large)   Pulse 65   Ht 5\' 9"  (1.753 m)   Wt 283 lb 8 oz (128.6 kg)   SpO2 97%   BMI 41.87 kg/m  , BMI Body mass index is 41.87 kg/m. GEN: Well nourished, well developed, in no acute distress  HEENT: normal  Neck: Jugular venous pressure is not well visualized, carotid bruits, or masses Cardiac: RRR; no murmurs, rubs, or gallops, mild  bilateral leg edema Respiratory:  clear to auscultation bilaterally, normal work of breathing GI: soft, nontender, nondistended, + BS MS: no deformity or atrophy  Skin: warm and dry, no rash Neuro:  Strength and sensation are intact Psych: euthymic mood, full affect   EKG:  EKG is ordered today. The ekg ordered today demonstrates sinus rhythm with first-degree AV block.  No significant ST changes.   Recent Labs: 02/28/2020: BUN 50; Creatinine, Ser 2.27; Hemoglobin 13.3; Platelets 207; Potassium 4.1; Sodium 139    Lipid Panel No results found for: CHOL, TRIG, HDL, CHOLHDL, VLDL, LDLCALC, LDLDIRECT    Wt Readings from Last 3 Encounters:  09/04/20 283 lb 8 oz (128.6 kg)  02/28/20 292 lb 4 oz (132.6 kg)  11/15/19 290 lb (131.5 kg)       PAD Screen 05/18/2018  Previous PAD dx? No  Previous surgical procedure? No  Pain with  walking? No  Feet/toe relief with dangling? No  Painful, non-healing ulcers? No  Extremities discolored? No      ASSESSMENT AND PLAN:  1.  Chronic diastolic heart failure with pulmonary hypertension: She seems to be stable and euvolemic on current diuretic dose.  Renal function has been stable with most recent creatinine of 2.27.    Continue furosemide 40 mg daily and metolazone 2.5 mg 2 times weekly.    3.  Essential hypertension: Blood pressure is well controlled on current medications  3.  Chronic kidney disease: Followed by [facility] nephrology.  4.  Previous episode of atrial fibrillation in the setting of decongestant use with no recurrent episodes.  No need for anticoagulation unless she develops  recurrent episodes.  5.  GERD: Improved with Nexium.  Cardiac work-up has been negative so far.    Disposition:   FU with me in 6 months  Signed,  Lorine Bears, MD  09/04/2020 8:33 AM    [facility]

## 2020-09-04 NOTE — Patient Instructions (Signed)
Medication Instructions:  Please continue your current medications, there are no changes at this. If you need any refills on your cardiac medications, please reach out to your pharmacy, they will send in request.  Lab Work: None  Testing/Procedures: None  Follow-Up: At [facility], you and your health needs are our priority.  As part of our continuing mission to provide you with exceptional heart care, we have created designated Provider Care Teams.  These Care Teams include your primary Cardiologist (physician) and Advanced Practice Providers (APPs -  Physician Assistants and Nurse Practitioners) who all work together to provide you with the care you need, when you need it.  We recommend signing up for the patient portal called "MyChart".  Sign up information is provided on this After Visit Summary.  MyChart is used to connect with patients for Virtual Visits (Telemedicine).  Patients are able to view lab/test results, encounter notes, upcoming appointments, etc.  Non-urgent messages can be sent to your provider as well.   To learn more about what you can do with MyChart, go to [email or web address].    Your next appointment:   6 month(s)  The format for your next appointment:   In Person  Provider:   You may see Lorine Bears, MD or one of the following Advanced Practice Providers on your designated Care Team:   Nicolasa Ducking, NP Eula Listen, PA-C Marisue Ivan, PA-C Cadence Fransico Michael, [place]    Other Instructions Low-Sodium Eating Plan Sodium, which is an element that makes up salt, helps you maintain a healthy balance of fluids in your body. Too much sodium can increase your bloodpressure and cause fluid and waste to be held in your body. Your health care provider or dietitian may recommend following this plan if you have high blood pressure (hypertension), kidney disease, liver disease, or heart failure. Eating less sodium can help lower your blood pressure, reduce  swelling, and protect your heart, liver, andkidneys. What are tips for following this plan? Reading food labels The Nutrition Facts label lists the amount of sodium in one serving of the food. If you eat more than one serving, you must multiply the listed amount of sodium by the number of servings. Choose foods with less than 140 mg of sodium per serving. Avoid foods with 300 mg of sodium or more per serving. Shopping  Look for lower-sodium products, often labeled as "low-sodium" or "no salt added." Always check the sodium content, even if foods are labeled as "unsalted" or "no salt added." Buy fresh foods. Avoid canned foods and pre-made or frozen meals. Avoid canned, cured, or processed meats. Buy breads that have less than 80 mg of sodium per slice.  Cooking  Eat more home-cooked food and less restaurant, buffet, and fast food. Avoid adding salt when cooking. Use salt-free seasonings or herbs instead of table salt or sea salt. Check with your health care provider or pharmacist before using salt substitutes. Cook with plant-based oils, such as canola, sunflower, or olive oil.  Meal planning When eating at a restaurant, ask that your food be prepared with less salt or no salt, if possible. Avoid dishes labeled as brined, pickled, cured, smoked, or made with soy sauce, miso, or teriyaki sauce. Avoid foods that contain MSG (monosodium glutamate). MSG is sometimes added to [place] food, bouillon, and some canned foods. Make meals that can be grilled, baked, poached, roasted, or steamed. These are generally made with less sodium. General information Most people on this plan should limit their sodium intake  to 1,500-2,000 mg (milligrams) of sodium each day. What foods should I eat? Fruits Fresh, frozen, or canned fruit. Fruit juice. Vegetables Fresh or frozen vegetables. "No salt added" canned vegetables. "No salt added"tomato sauce and paste. Low-sodium or reduced-sodium tomato and  vegetable juice. Grains Low-sodium cereals, including oats, puffed wheat and rice, and shredded wheat. Low-sodium crackers. Unsalted rice. Unsalted pasta. Low-sodium bread.Whole-grain breads and whole-grain pasta. Meats and other proteins Fresh or frozen (no salt added) meat, poultry, seafood, and fish. Low-sodium canned tuna and salmon. Unsalted nuts. Dried peas, beans, and lentils withoutadded salt. Unsalted canned beans. Eggs. Unsalted nut butters. Dairy Milk. Soy milk. Cheese that is naturally low in sodium, such as ricotta cheese, fresh mozzarella, or Swiss cheese. Low-sodium or reduced-sodium cheese. Creamcheese. Yogurt. Seasonings and condiments Fresh and dried herbs and spices. Salt-free seasonings. Low-sodium mustard and ketchup. Sodium-free salad dressing. Sodium-free light mayonnaise. Fresh orrefrigerated horseradish. Lemon juice. Vinegar. Other foods Homemade, reduced-sodium, or low-sodium soups. Unsalted popcorn and pretzels.Low-salt or salt-free chips. The items listed above may not be a complete list of foods and beverages you can eat. Contact a dietitian for more information. What foods should I avoid? Vegetables Sauerkraut, pickled vegetables, and relishes. Olives. [place] fries. Onion rings. Regular canned vegetables (not low-sodium or reduced-sodium). Regular canned tomato sauce and paste (not low-sodium or reduced-sodium). Regular tomato and vegetable juice (not low-sodium or reduced-sodium). Frozenvegetables in sauces. Grains Instant hot cereals. Bread stuffing, pancake, and biscuit mixes. Croutons. Seasoned rice or pasta mixes. Noodle soup cups. Boxed or frozen macaroni andcheese. Regular salted crackers. Self-rising flour. Meats and other proteins Meat or fish that is salted, canned, smoked, spiced, or pickled. Precooked or cured meat, such as sausages or meat loaves. Tomasa Blase. Ham. Pepperoni. Hot dogs. Corned beef. Chipped beef. Salt pork. Jerky. Pickled herring. Anchovies  andsardines. Regular canned tuna. Salted nuts. Dairy Processed cheese and cheese spreads. Hard cheeses. Cheese curds. Blue cheese.Feta cheese. String cheese. Regular cottage cheese. Buttermilk. Canned milk. Fats and oils Salted butter. Regular margarine. Ghee. Bacon fat. Seasonings and condiments Onion salt, garlic salt, seasoned salt, table salt, and sea salt. Canned and packaged gravies. Worcestershire sauce. Tartar sauce. Barbecue sauce. Teriyaki sauce. Soy sauce, including reduced-sodium. Steak sauce. Fish sauce. Oyster sauce. Cocktail sauce. Horseradish that you find on the shelf. Regular ketchup and mustard. Meat flavorings and tenderizers. Bouillon cubes. Hot sauce. Pre-made or packaged marinades. Pre-made or packaged taco seasonings. Relishes.Regular salad dressings. Salsa. Other foods Salted popcorn and pretzels. Corn chips and puffs. Potato and tortilla chips.Canned or dried soups. Pizza. Frozen entrees and pot pies. The items listed above may not be a complete list of foods and beverages you should avoid. Contact a dietitian for more information. Summary Eating less sodium can help lower your blood pressure, reduce swelling, and protect your heart, liver, and kidneys. Most people on this plan should limit their sodium intake to 1,500-2,000 mg (milligrams) of sodium each day. Canned, boxed, and frozen foods are high in sodium. Restaurant foods, fast foods, and pizza are also very high in sodium. You also get sodium by adding salt to food. Try to cook at home, eat more fresh fruits and vegetables, and eat less fast food and canned, processed, or prepared foods. This information is not intended to replace advice given to you by your health care provider. Make sure you discuss any questions you have with your healthcare provider. Document Revised: 04/13/2019 Document Reviewed: 02/07/2019 Elsevier Patient Education  2022 [employer].

## 2020-09-15 ENCOUNTER — Other Ambulatory Visit: Payer: Self-pay | Admitting: Internal Medicine

## 2020-09-15 DIAGNOSIS — Z1231 Encounter for screening mammogram for malignant neoplasm of breast: Secondary | ICD-10-CM

## 2020-10-04 ENCOUNTER — Other Ambulatory Visit: Payer: Self-pay

## 2020-10-04 ENCOUNTER — Encounter: Payer: Self-pay | Admitting: Emergency Medicine

## 2020-10-04 ENCOUNTER — Emergency Department
Admission: EM | Admit: 2020-10-04 | Discharge: 2020-10-04 | Disposition: A | Discharge status: Home / Self Care | Payer: Medicare HMO | Attending: Emergency Medicine | Admitting: Emergency Medicine

## 2020-10-04 ENCOUNTER — Emergency Department: Payer: Medicare HMO

## 2020-10-04 DIAGNOSIS — R109 Unspecified abdominal pain: Secondary | ICD-10-CM | POA: Diagnosis not present

## 2020-10-04 DIAGNOSIS — Z79899 Other long term (current) drug therapy: Secondary | ICD-10-CM | POA: Insufficient documentation

## 2020-10-04 DIAGNOSIS — J449 Chronic obstructive pulmonary disease, unspecified: Secondary | ICD-10-CM | POA: Diagnosis not present

## 2020-10-04 DIAGNOSIS — Y92410 Unspecified street and highway as the place of occurrence of the external cause: Secondary | ICD-10-CM | POA: Insufficient documentation

## 2020-10-04 DIAGNOSIS — N183 Chronic kidney disease, stage 3 unspecified: Secondary | ICD-10-CM | POA: Insufficient documentation

## 2020-10-04 DIAGNOSIS — I509 Heart failure, unspecified: Secondary | ICD-10-CM | POA: Diagnosis not present

## 2020-10-04 DIAGNOSIS — I130 Hypertensive heart and chronic kidney disease with heart failure and stage 1 through stage 4 chronic kidney disease, or unspecified chronic kidney disease: Secondary | ICD-10-CM | POA: Insufficient documentation

## 2020-10-04 DIAGNOSIS — Z87891 Personal history of nicotine dependence: Secondary | ICD-10-CM | POA: Insufficient documentation

## 2020-10-04 DIAGNOSIS — T1490XA Injury, unspecified, initial encounter: Secondary | ICD-10-CM | POA: Diagnosis not present

## 2020-10-04 DIAGNOSIS — Z7982 Long term (current) use of aspirin: Secondary | ICD-10-CM | POA: Insufficient documentation

## 2020-10-04 LAB — COMPREHENSIVE METABOLIC PANEL
ALT: 15 U/L (ref 0–44)
AST: 15 U/L (ref 15–41)
Albumin: 3.6 g/dL (ref 3.5–5.0)
Alkaline Phosphatase: 74 U/L (ref 38–126)
Anion gap: 11 (ref 5–15)
BUN: 49 mg/dL — ABNORMAL HIGH (ref 8–23)
CO2: 26 mmol/L (ref 22–32)
Calcium: 9.7 mg/dL (ref 8.9–10.3)
Chloride: 101 mmol/L (ref 98–111)
Creatinine, Ser: 2.08 mg/dL — ABNORMAL HIGH (ref 0.44–1.00)
GFR, Estimated: 24 mL/min — ABNORMAL LOW (ref >60)
Glucose, Bld: 111 mg/dL — ABNORMAL HIGH (ref 70–99)
Potassium: 3.5 mmol/L (ref 3.5–5.1)
Sodium: 138 mmol/L (ref 135–145)
Total Bilirubin: 1 mg/dL (ref 0.3–1.2)
Total Protein: 7.6 g/dL (ref 6.5–8.1)

## 2020-10-04 LAB — URINALYSIS, COMPLETE (UACMP) WITH MICROSCOPIC
Bilirubin Urine: NEGATIVE
Glucose, UA: NEGATIVE mg/dL
Ketones, ur: NEGATIVE mg/dL
Nitrite: NEGATIVE
Protein, ur: NEGATIVE mg/dL
Specific Gravity, Urine: 1.009 (ref 1.005–1.030)
pH: 5 (ref 5.0–8.0)

## 2020-10-04 LAB — CBC WITH DIFFERENTIAL/PLATELET
Abs Immature Granulocytes: 0.02 10*3/uL (ref 0.00–0.07)
Basophils Absolute: 0 10*3/uL (ref 0.0–0.1)
Basophils Relative: 0 %
Eosinophils Absolute: 0.2 10*3/uL (ref 0.0–0.5)
Eosinophils Relative: 3 %
HCT: 40.8 % (ref 36.0–46.0)
Hemoglobin: 13 g/dL (ref 12.0–15.0)
Immature Granulocytes: 0 %
Lymphocytes Relative: 23 %
Lymphs Abs: 1.9 10*3/uL (ref 0.7–4.0)
MCH: 27 pg (ref 26.0–34.0)
MCHC: 31.9 g/dL (ref 30.0–36.0)
MCV: 84.6 fL (ref 80.0–100.0)
Monocytes Absolute: 0.7 10*3/uL (ref 0.1–1.0)
Monocytes Relative: 8 %
Neutro Abs: 5.3 10*3/uL (ref 1.7–7.7)
Neutrophils Relative %: 66 %
Platelets: 190 10*3/uL (ref 150–400)
RBC: 4.82 MIL/uL (ref 3.87–5.11)
RDW: 14 % (ref 11.5–15.5)
WBC: 8.2 10*3/uL (ref 4.0–10.5)
nRBC: 0 % (ref 0.0–0.2)

## 2020-10-04 LAB — TYPE AND SCREEN
ABO/RH(D): B POS
Antibody Screen: NEGATIVE
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
## 2020-10-04 NOTE — ED Notes (Signed)
Pt back from CT at this time 
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
## 2020-10-04 NOTE — Discharge Instructions (Addendum)
You can take Tylenol as needed for discomfort.

## 2020-11-27 ENCOUNTER — Other Ambulatory Visit: Payer: Self-pay

## 2020-11-27 ENCOUNTER — Ambulatory Visit
Admission: RE | Admit: 2020-11-27 | Discharge: 2020-11-27 | Disposition: A | Discharge status: Home / Self Care | Payer: Medicare HMO | Source: Ambulatory Visit | Attending: Internal Medicine | Admitting: Internal Medicine

## 2020-11-27 DIAGNOSIS — Z1231 Encounter for screening mammogram for malignant neoplasm of breast: Secondary | ICD-10-CM | POA: Insufficient documentation

## 2021-02-03 ENCOUNTER — Encounter

## 2021-02-04 ENCOUNTER — Other Ambulatory Visit

## 2021-02-04 ENCOUNTER — Encounter (INDEPENDENT_AMBULATORY_CARE_PROVIDER_SITE_OTHER): Admitting: Orthopaedic Surgery

## 2021-02-04 ENCOUNTER — Encounter (INDEPENDENT_AMBULATORY_CARE_PROVIDER_SITE_OTHER): Admitting: Rehabilitative and Restorative Service Providers"

## 2021-02-04 ENCOUNTER — Institutional Professional Consult (permissible substitution)
Admit: 2021-02-04 | Discharge: 2021-02-04 | Payer: MEDICARE | Attending: Rehabilitative and Restorative Service Providers"

## 2021-02-04 DIAGNOSIS — M7062 Trochanteric bursitis, left hip: Secondary | ICD-10-CM

## 2021-02-04 NOTE — Progress Notes (Addendum)
Physical Therapy Evaluation    Patient Name: Taylor Christensen  MRN: [id number]  DOB: [birth year]  Evaluation Date: 02/04/2021  Referral Diagnosis:  Greater Trochanteric Bursitis Left Hip, M70.62    Subjective     History of Present Condition/Mechanism of Injury: Patient is a 75 y.o. female who presents with a 3 month h/o L Shoulder pain of insidious onset. Xrays taken L hip on 01/12/21 reveals normal bony anatomy       Pertinent Past Medical History/Surgical History:           General Visit Information:       Prior Level of Function: Patient was independent with all functional activities prior to this injury       Current Level of Function:  Transitional Mobility: min limitation  Walking: min limitation  Stairs: Ascending- mod pain/difficulty  Sleeping: min limitation  Comments: ADL's- min limition    Home Environment:       Pain  Pain Assessment  Pain Assessment:  (0-4.5/10)        Objective   Posture/Observation       Functional Mobility   OP PT General Evaluation     Row Name 02/04/21 1400          Gait Assessment    Comments NO deviations                 Range of Motion       Hip AROM PROM    Right Left Right  Left   Flexion WNL WNL       Extension           Abduction 25 30       ADduction           Internal Rotation           External Rotation 32 24           Strength       Hip Right Left   Flexion 4+/5 4+/5   Extension 5/5  (4 to 4+/5)   ABduction   4+/5   Adduction       Internal Rotation       External Rotation 4+/5 4+/5       Knee Right Left   Flexion       Extension 5/5 5/5       Palpation  Palpation/Inspection  Palpation/Inspection: mod tender R proximal vastus laterallis/prox. IT band/Greater trochanter  Balance       Neuro-Vascular                           Examination  Hip Special Examination  Thomas Test:  (B- slight +)  Ely's Test:  (B- min/mod +)  Ober:  (L- min/mod + , R- min +)  Comments: Piriformis: B- slight/min +                    Outcome Measures       OP PT General Evaluation     Row Name  02/04/21 1400          HOOS Jr.    PAIN: Going up or down stairs 2     PAIN: Walking on an uneven surface 1     FUNCTION, DAILY LIVING: Rising from sitting 1     FUNCTION, DAILY LIVING: Bending to floor/pick up an object 1     FUNCTION, DAILY LIVING: Lying in bed (turning over, maintaining hip position) 1  FUNCTION, DAILY LIVING: Sitting 0     Raw Score 6     Interval Score 70.43                  Assessment   Assessment: patient's L hip pain appears due to significant decreased flexibility L IT band, some irritation prox. Vastus laterallis and irritation trochanteric Bursa. Patient presents with mod pain levels, min decreased AROM L hip ER, min weakness L Hip Ext, slight weakness L hip ABd. Patient educated on proper activity modification and instructed in HEP of L Hip flexor/L Quad stretching/Hip Exten strengthening           Goals:  Active     PT LONG TERM GOAL     Define Custom Goal     Start: 02/04/21   Expected End: 03/11/21       1. Patient will be able to ascend stairs with slight pain/difficulty  2. Patient will be able to ambulate for prolonged periods without pain/difficulty  3. Patient will be able to perform all transfers without pain/difficulty  4. Patient will be able to sleep through the night without pain/difficulty          PT SHORT TERM GOAL     Define Custom Goal     Start: 02/04/21   Expected End: 02/25/21       1. Decrease pain levels to a 0-3/10  2. Increase AROM L hip ER 6  3. Increase strength L hip Abd/Ext to 5/5  4. Increase flexibility L Quads 50%             Plan   Plan: Skilled PT 2x/wk for 5 weeks       Therapy Time; 45 minutes  PT Evaluation Time Entry  PT Evaluation (Moderate) Time Entry: 45    Tyrone Nine, PT  [place] lic # [id number]

## 2021-02-06 ENCOUNTER — Other Ambulatory Visit

## 2021-02-06 ENCOUNTER — Encounter
Admit: 2021-02-06 | Discharge: 2021-02-06 | Payer: MEDICARE | Attending: Rehabilitative and Restorative Service Providers"

## 2021-02-06 DIAGNOSIS — M7062 Trochanteric bursitis, left hip: Secondary | ICD-10-CM

## 2021-02-06 NOTE — Progress Notes (Signed)
 Physical Therapy Visit Treatment Note    Patient Name: Taylor Christensen  MRN: [id number]  DOB: 07-Sep-1945  Today's Date: 02/06/2021  Referral Diagnosis:  M70.62 Trochanteric bursitis, left hip     Subjective   Subjective: patient reports hip feels pretty good this morning    Objective   Objective:         Assessment   Assessment: Patient reports good compliance with HEP without any c/o's pain  Patient relates she walked for a mile x 2 yesterday without any c/o's pain. Patient experienced max tenderness with soft tissue mobilizations stick to L prox. IT band only being able to tolerate very slight pressure    Plan   Plan: Con't per present treatment plan    Therapy Time: 40 minutes       Tyrone Nine, PT  [place] lic # [id number]

## 2021-02-18 ENCOUNTER — Encounter
Admit: 2021-02-18 | Discharge: 2021-02-18 | Payer: MEDICARE | Attending: Rehabilitative and Restorative Service Providers"

## 2021-02-18 ENCOUNTER — Other Ambulatory Visit

## 2021-02-18 DIAGNOSIS — M7062 Trochanteric bursitis, left hip: Secondary | ICD-10-CM

## 2021-02-18 NOTE — Progress Notes (Signed)
 Physical Therapy Visit Treatment Note    Patient Name: Taylor Christensen  MRN: [id number]  DOB: 1945/07/21  Today's Date: 02/18/2021  Referral Diagnosis:  M70.62 Trochanteric bursitis, left hip     Subjective   Subjective: Patient reports Hip feels about the same    Objective   Objective:        OP PT Treatment - Wed February 18, 2021     Row Name 1645       Pain Assessment    Pain Assessment 0-10    Pain Score --  0-4.5/10       Objective Measurements    ROM Hip ER- 26       Therapeutic Exercise    Exercise LTR Stretch to L- 3x 30 sec    Exercise L hip flexor stretch- 3x 30 sec    Exercise L Quads stretch with strap prone- 3x 30 sec    Exercise Bridges- 10 x 3 3 sec holds    Exercise Side step Hip Abd- 40 feet x 3 with orange tband       Manual Therapy    Manual Therapy Soft tissue mobilization stich to prox IT band/Prox. vastus laterallis- x 10 mins       Modalities    Moist Heat (min) L anterolateral hip x 10 mins    Cryotherapy (Minutes\Location) 10 mins L anterolateral hip                Assessment   Assessment: Patient presents with con't unchanged mod pain levels in the past 2 weeks. Patient has gained some increased AROM Hip ER since initial eval making steady progress towards STG 2.    Plan   Plan: Con't with present treatment    Therapy Time  PT Therapeutic Procedures Time Entry  Manual Therapy Time Entry: 10  Therapeutic Exercise Time Entry: 13    Tyrone Nine, PT  [place] lic # [id number]

## 2021-02-24 ENCOUNTER — Other Ambulatory Visit

## 2021-02-24 ENCOUNTER — Encounter: Admit: 2021-02-24 | Discharge: 2021-02-24 | Payer: MEDICARE

## 2021-02-24 DIAGNOSIS — M7062 Trochanteric bursitis, left hip: Secondary | ICD-10-CM

## 2021-02-24 NOTE — Progress Notes (Signed)
 Physical Therapy Visit Treatment Note    Patient Name: Taylor Christensen  MRN: [id number]  DOB: 02-08-1946  Initial Eval Date:  02/04/2021  Referral Diagnosis:  M70.62 Trochanteric bursitis, left hip    Subjective   Subjective: "It's tight when I walk, but not bad." Pt states that she responded well to her last treatment.    Objective   Objective:        OP PT Treatment - Tue February 24, 2021     Row Name 0700       Pain Assessment    Pain Assessment 0-10    Pain Score 1       Therapeutic Exercise    Exercise LTR Stretch to L- 3x 30 sec    Exercise L hip flexor stretch- 3x 30 sec    Exercise L Quads stretch with strap prone- 3x 30 sec    Exercise Bridges- 10 x 3 3 sec holds    Exercise Side step Hip Abd- 40 feet x 3 with orange tband       Manual Therapy    Manual Therapy Soft tissue mobilization stich to prox IT band/Prox. vastus laterallis- x 10 mins       Modalities    Moist Heat (min) L anterolateral hip x 10 mins    Cryotherapy (Minutes\Location) 10 mins L anterolateral hip                Assessment   Assessment: Pt had some tenderness with stm, however stated that manual therapy has provided good relief.     Plan   Plan: Continue per PT POC.     Therapy Time  PT Therapeutic Procedures Time Entry  Manual Therapy Time Entry: 10  Therapeutic Exercise Time Entry: 51    Demetrius Charity, PTA  [place] lic # [id number]

## 2021-02-27 ENCOUNTER — Other Ambulatory Visit

## 2021-02-27 ENCOUNTER — Encounter
Admit: 2021-02-27 | Discharge: 2021-02-27 | Payer: MEDICARE | Attending: Rehabilitative and Restorative Service Providers"

## 2021-02-27 DIAGNOSIS — M7062 Trochanteric bursitis, left hip: Secondary | ICD-10-CM

## 2021-02-27 NOTE — Progress Notes (Signed)
Physical Therapy Visit Treatment Note    Patient Name: Taylor Christensen  MRN: [id number]  DOB: 09-28-1945  Initial Eval Date:  02/04/2021  Referral Diagnosis:  M70.62 Trochanteric bursitis, left hip    Subjective   Subjective: Patient relates hip has been feeling good with only pain now is when walking up and down stairs    Objective   Objective:        OP PT Treatment - Fri February 27, 2021     Row Name 1245       Pain Assessment    Pain Assessment 0-10  0-2/10    Pain Score --  0-2/10       Objective Measurements    Strength Hip Abd- 5/5, Ext- 5/5       Therapeutic Exercise    Exercise LTR Stretch to L- 3x 30 sec    Exercise L hip flexor stretch- 3x 30 sec    Exercise L Quads stretch with strap prone- 3x 30 sec    Exercise Bridges- 10 x 3 3 sec holds    Exercise Side step Hip Abd- 40 feet x 3 with orange tband       Manual Therapy    Manual Therapy Soft tissue mobilization stich to prox IT band/Prox. vastus laterallis- x 10 mins       Modalities    Moist Heat (min) L anterolateral hip x 10 mins    Cryotherapy (Minutes\Location) 10 mins L anterolateral hip                Assessment   Assessment: Patient presents with decreased pain levels in the past week and a half making good progress towards STG 1. Patient has gained significant increased strength Hip Ext-ABd since initial eval making excellent progress towards STG 3  Plan   Plan: Con't with present treatment    Therapy Time  PT Modalities Time Entry  Hot/Cold Pack Time Entry: 20  PT Therapeutic Procedures Time Entry  Manual Therapy Time Entry: 10  Therapeutic Exercise Time Entry: 13    Tyrone Nine, PT  [place] lic # [id number]

## 2021-03-03 ENCOUNTER — Other Ambulatory Visit

## 2021-03-03 ENCOUNTER — Encounter: Admit: 2021-03-03 | Discharge: 2021-03-03 | Payer: MEDICARE

## 2021-03-03 DIAGNOSIS — M7062 Trochanteric bursitis, left hip: Secondary | ICD-10-CM

## 2021-03-03 NOTE — Progress Notes (Signed)
 Physical Therapy Visit Treatment Note    Patient Name: Taylor Christensen  MRN: [id number]  DOB: [birth year]  Initial Eval Date:  02/04/2021  Referral Diagnosis:  M70.62 Trochanteric bursitis, left hip    Subjective    Subjective: Pt states that her hip pain is low today and she has had relief with stretches.    Objective   Objective:        OP PT Treatment - Tue March 03, 2021     Row Name 0700       Pain Assessment    Pain Assessment 0-10  0-2/10    Pain Score 2       Objective Measurements    Strength Hip Abd- 5/5, Ext- 5/5       Therapeutic Exercise    Exercise LTR Stretch to R- 3x 30 sec    Exercise L hip flexor stretch- 3x 30 sec    Exercise L Quads stretch with strap prone- 3x 30 sec    Exercise Bridges- 15 x 3 3 sec holds    Exercise Side step Hip Abd- 40 feet x 3 with orange tband       Manual Therapy    Manual Therapy Soft tissue mobilization stich to prox IT band/Prox. vastus laterallis- x 10 mins       Modalities    Moist Heat (min) L anterolateral hip x 10 mins    Cryotherapy (Minutes\Location) 10 mins L anterolateral hip                Assessment   Assessment: Pt had some tenderness through out manual therapy. Good tolerance to treatment over all.     Plan   Plan: Continue per PT POC.    Therapy Time  PT Therapeutic Procedures Time Entry  Manual Therapy Time Entry: 12  Therapeutic Exercise Time Entry: 68    Demetrius Charity, PTA  [place] lic # [id number]

## 2021-03-06 ENCOUNTER — Other Ambulatory Visit

## 2021-03-06 ENCOUNTER — Encounter (INDEPENDENT_AMBULATORY_CARE_PROVIDER_SITE_OTHER)

## 2021-03-06 ENCOUNTER — Encounter
Admit: 2021-03-06 | Discharge: 2021-03-06 | Payer: MEDICARE | Attending: Rehabilitative and Restorative Service Providers"

## 2021-03-06 DIAGNOSIS — M7062 Trochanteric bursitis, left hip: Secondary | ICD-10-CM

## 2021-03-06 NOTE — Progress Notes (Addendum)
 Physical Therapy Progress Note - Re-Evaluation- Discharge      Patient Name: Taylor Christensen  MRN: [id number]  DOB: [birth year]  Initial Eval Date:  02/04/2021  Re-Evaluation Date: 03/06/2021  Referral Diagnosis: M70.62 Trochanteric bursitis, left hip    Subjective     General Visit Information:       Prior Level of Function:       Current Functional Status  Self Care: slight limitation  Transitional Mobility: slight limitation  Walking: No pain/difficulty  Stairs: slight pain ascending  Sleeping: slight pain    Subjective: patient relates hip feels al;ot better with just slight pain when walking up stairs    Pain           Objective     Posture/Observation       Functional Mobility         OP PT General Evaluation     Row Name 02/04/21 1400          Gait Assessment    Comments NO deviations                 Range of Motion         OP PT General Evaluation     Row Name 03/06/21 1300 02/04/21 1400       AROM Right Lower Extremity    R Hip Flexion 0-125 -- WNL    R Hip ABduction 0-45 -- 25    R External Hip Rotation 0-45 -- 32       AROM Left Lower Extremity    L Hip Flexion 0-125 -- WNL    L Hip ABduction 0-45 -- 30    L Hip External Rotation 0-45 34 24                Strength         OP PT General Evaluation     Row Name 03/06/21 1300 02/04/21 1400       Strength Right Lower Extremity    R Hip Flexion -- 4+/5    R Hip Extension -- 5/5    R Hip External Rotation -- 4+/5    R Knee Extension -- 5/5       Strength Left Lower Extremity    L Hip Flexion -- 4+/5    L Hip Extension 5/5 --  4 to 4+/5    L Hip ABduction 5/5 4+/5    L External Rotation 4+/5 4+/5    L Knee Extension -- 5/5                Palpation  Palpation/Inspection  Palpation/Inspection: Mod tender prox. It band, MIn tender L Greater TRochanter, Slight tender prox. vastus laterallis    Balance       Neuro-Vascular                           Examination  Hip Special Examination  Ely's Test:  (slight/min +)  Ober:  (L- slight +)                Outcome Measures      OP PT General Evaluation     Row Name 03/06/21 1300 02/04/21 1400       HOOS Jr.    PAIN: Going up or down stairs 1 2    PAIN: Walking on an uneven surface 0 1    FUNCTION, DAILY LIVING: Rising from sitting 1 1    FUNCTION, DAILY LIVING:  Bending to floor/pick up an object 0 1    FUNCTION, DAILY LIVING: Lying in bed (turning over, maintaining hip position) 1 1    FUNCTION, DAILY LIVING: Sitting 0 0    Raw Score 3 6    Interval Score 80.55 70.43                Treatment   OP PT Treatment - Fri March 06, 2021     Row Name 1315       Therapeutic Exercise    Exercise Clamshells- 10 x 3 with Orange tband , 5 sec holds    Exercise Bridges- 10 x 3 3 sec holds    Row Name 1300       Objective Measurements    Palpation/Inspection Mod tender prox. It band, MIn tender L Greater TRochanter, Slight tender prox. vastus laterallis       Modalities    Moist Heat (min) L anterolateral hip x 10 min                Assessment   Assessment: patient has made  Mod/max          Progress as patient presents with mod/max decreased pain levels, increased AROM ER now = R, increased strength throughout L Hip now = R, increased flexibility L hip now = R, less irritation vastus laterallis muscle proximally and report of less pain/difficulty with ADL's/sleeping/ascend stairstransfers and no pain/difficulty with prolonged ambulation. At present time it appears patient has received maximal benefit from treatment and will be discharged onto HEP         Goals:    Resolved     Define Custom Goal (Met)     Start: 02/04/21   Expected End: 03/11/21   Met: 03/06/21    1. Patient will be able to ascend stairs with slight pain/difficulty  2. Patient will be able to ambulate for prolonged periods without pain/difficulty  3. Patient will be able to perform all transfers without pain/difficulty  4. Patient will be able to sleep through the night without pain/difficulty       Define Custom Goal (Met)     Start: 02/04/21   Expected End: 02/25/21   Met: 03/06/21     1. Decrease pain levels to a 0-3/10  2. Increase AROM L hip ER 6  3. Increase strength L hip Abd/Ext to 5/5  4. Increase flexibility L Quads 50%          PT LONG TERM GOAL     Define Custom Goal (Met)     Start: 02/04/21   Expected End: 03/11/21   Met: 03/06/21    1. Patient will be able to ascend stairs with slight pain/difficulty  2. Patient will be able to ambulate for prolonged periods without pain/difficulty  3. Patient will be able to perform all transfers without pain/difficulty  4. Patient will be able to sleep through the night without pain/difficulty      PT SHORT TERM GOAL     Define Custom Goal (Met)     Start: 02/04/21   Expected End: 02/25/21   Met: 03/06/21    1. Decrease pain levels to a 0-3/10  2. Increase AROM L hip ER 6  3. Increase strength L hip Abd/Ext to 5/5  4. Increase flexibility L Quads 50%             Plan   Plan  Plan  PT Frequency: Discharge  Duration: Other (Comment)    Therapy Time  PT Evaluation Time Entry  PT Re-Evaluation Time Entry: 20  PT Modalities Time Entry  Hot/Cold Pack Time Entry: 10  PT Therapeutic Procedures Time Entry  Therapeutic Exercise Time Entry: 8    Tyrone Nine, PT  [place] lic # [id number]

## 2021-03-06 NOTE — Plan of Care (Signed)
 Outpatient Physical Therapy Plan of Care    Patient Name: Taylor Christensen  MRN: [id number]  DOB: 10/21/1945  Initial Eval Date:  02/04/2021  M70.62 Trochanteric bursitis, left hip    Assessment: patient has made  Mod/max          Progress as patient presents with mod/max decreased pain levels, increased AROM ER now = R, increased strength throughout L Hip now = R, increased flexibility L hip now = R, less irritation vastus laterallis muscle proximally and report of less pain/difficulty with ADL's/sleeping/ascend stairstransfers and no pain/difficulty with prolonged ambulation. At present time it appears patient has received maximal benefit from treatment and will be discharged onto HEP         Goals:  Active     Define Custom Goal     Start: 02/04/21   Expected End: 03/11/21       1. Patient will be able to ascend stairs with slight pain/difficulty  2. Patient will be able to ambulate for prolonged periods without pain/difficulty  3. Patient will be able to perform all transfers without pain/difficulty  4. Patient will be able to sleep through the night without pain/difficulty       Define Custom Goal     Start: 02/04/21   Expected End: 02/25/21       1. Decrease pain levels to a 0-3/10  2. Increase AROM L hip ER 6  3. Increase strength L hip Abd/Ext to 5/5  4. Increase flexibility L Quads 50%                Plan  Plan  PT Frequency: Discharge  Duration: Other (Comment)    Tyrone Nine, PT  [place] lic # [id number]

## 2021-04-08 DIAGNOSIS — N183 Chronic kidney disease, stage 3 unspecified: Secondary | ICD-10-CM | POA: Diagnosis not present

## 2021-04-08 DIAGNOSIS — R051 Acute cough: Secondary | ICD-10-CM | POA: Diagnosis not present

## 2021-04-08 DIAGNOSIS — E782 Mixed hyperlipidemia: Secondary | ICD-10-CM | POA: Diagnosis not present

## 2021-04-08 DIAGNOSIS — N281 Cyst of kidney, acquired: Secondary | ICD-10-CM | POA: Diagnosis not present

## 2021-04-08 DIAGNOSIS — I10 Essential (primary) hypertension: Secondary | ICD-10-CM | POA: Diagnosis not present

## 2021-04-12 ENCOUNTER — Other Ambulatory Visit: Payer: Self-pay | Admitting: *Deleted

## 2021-04-12 DIAGNOSIS — Z87891 Personal history of nicotine dependence: Secondary | ICD-10-CM

## 2021-05-01 DIAGNOSIS — Z0000 Encounter for general adult medical examination without abnormal findings: Secondary | ICD-10-CM | POA: Diagnosis not present

## 2021-05-01 DIAGNOSIS — Z0130 Encounter for examination of blood pressure without abnormal findings: Secondary | ICD-10-CM | POA: Diagnosis not present

## 2021-05-01 DIAGNOSIS — R053 Chronic cough: Secondary | ICD-10-CM | POA: Diagnosis not present

## 2021-05-14 NOTE — Progress Notes (Signed)
Cardiology Office Note:    Date:  05/15/2021   ID:  [name], DOB 1946-02-18, MRN [id number]  PCP:  [facility] Cardiologist:  Kathlyn Sacramento, MD  [place] Electrophysiologist:  None   Referring MD: [facility], [facility]   Chief Complaint: 6 month follow-up  History of Present Illness:    Holly Townsend is a 76 y.o. female with a hx of chronic diastolic heart failure, pulmonary HTN, Paroxysmal Afib, OSA, obesity, CKD stage 3, HTN, remote tobacco use who presents for 6 month follow-up.   She was diagnosed with Afib in 2013 in the setting of decongestant use. She spontaneously converted to normal rhythm. No known recurrent afib since then and she has not been anticoagulated due to low burden.   CT scan of the lungs for cancer screening in 2019 showed no evidence of coronary atherosclerosis.  However, it did show evidence of cardiomegaly and enlarged pulmonary artery.  There was evidence of emphysema on CT scan. Echocardiogram in March 2020 showed normal LV systolic function with moderate to severe pulmonary hypertension with estimated systolic pulmonary pressure of 63 mmHg.  There was mild to moderate tricuspid regurgitation.  Lexiscan Myoview showed no evidence of ischemia.  Repeat echocardiogram in July 2021 showed normal LV systolic function with mild to moderate pulmonary hypertension.  Peak systolic pulmonary pressure was 48 mmHg.  Last seen 08/2020 and was overall stable with no recent chest pain. Following with nephrology.   Today, the patient reports she has been doing well since th last visit. No chest pain. She has shortness of breath that is chronic and unchanged. Breathing is worse with exertion. No LLE, orthopnea, pnd, palpitations, lightheadedness or dizziness. EKG shows SR with first degree AV block, she is on Aspirin daily, no bleeding with this. She follows with nephrology, labs drawn recently   Past Medical  History:  Diagnosis Date   (HFpEF) heart failure with preserved ejection fraction (HCC)    Allergic rhinitis    CKD (chronic kidney disease) stage 3, GFR 30-59 ml/min (HCC)    Colon polyps    COPD (chronic obstructive pulmonary disease) (HCC)    Hypertension    Osteoarthritis    right knee   Paroxysmal atrial fibrillation (HCC)    Pulmonary fibrosis ([place])     Past Surgical History:  Procedure Laterality Date   BREAST EXCISIONAL BIOPSY Left 1965   NEG   CATARACT EXTRACTION, BILATERAL     COLONOSCOPY WITH PROPOFOL N/A 05/30/2019   Procedure: COLONOSCOPY WITH PROPOFOL;  Surgeon: Toledo, Benay Pike, MD;  Location: [facility] ENDOSCOPY;  Service: Gastroenterology;  Laterality: N/A;   EYE SURGERY  2017   detached retina   VAGINAL HYSTERECTOMY      Current Medications: Current Meds  Medication Sig   Acetaminophen (TYLENOL ARTHRITIS PAIN PO) Take by mouth 2 (two) times daily.    albuterol (VENTOLIN HFA) 108 (90 Base) MCG/ACT inhaler Inhale 1-2 puffs into the lungs as needed.    aspirin 81 MG tablet Take 81 mg by mouth daily.   cetirizine (ZYRTEC) 10 MG tablet Take 10 mg by mouth daily.   Cholecalciferol (VITAMIN D PO) Take 1,000 Int'l Units by mouth daily.    Cyanocobalamin (VITAMIN B12 PO) Take by mouth.   esomeprazole (NEXIUM) 40 MG capsule Take 40 mg by mouth daily.   fluticasone (FLONASE) 50 MCG/ACT nasal spray Place 1 spray into the nose as needed.    furosemide (LASIX) 20 MG tablet Take  20 mg by mouth daily.   HYDROcodone-Chlorpheniramine 5-4 MG/5ML SOLN Take 5 mLs by mouth at bedtime as needed.   losartan (COZAAR) 100 MG tablet Take 100 mg by mouth daily.    metolazone (ZAROXOLYN) 2.5 MG tablet Take 2.5 mg by mouth 2 (two) times a week. Monday and Friday.   metoprolol succinate (TOPROL-XL) 100 MG 24 hr tablet Take 100 mg by mouth daily.    omeprazole (PRILOSEC) 20 MG capsule Take 20 mg by mouth daily.   potassium chloride SA (K-DUR) 20 MEQ tablet Take 1 tablet (20 mEq total) by  mouth daily.   SPIRIVA HANDIHALER 18 MCG inhalation capsule 1 capsule daily.     Allergies:   Penicillins, Statins, Pravastatin sodium, and Sulfamethoxazole-trimethoprim   Social History   Socioeconomic History   Marital status: Widowed    Spouse name: Not on file   Number of children: Not on file   Years of education: Not on file   Highest education level: Not on file  Occupational History   Not on file  Tobacco Use   Smoking status: Former    Packs/day: 1.50    Years: 47.00    Pack years: 70.50    Types: Cigarettes    Quit date: 2009    Years since quitting: 14.1   Smokeless tobacco: Never  Substance and Sexual Activity   Alcohol use: No   Drug use: No   Sexual activity: Not on file  Other Topics Concern   Not on file  Social History Narrative   Not on file   Social Determinants of Health   Financial Resource Strain: Not on file  Food Insecurity: Not on file  Transportation Needs: Not on file  Physical Activity: Not on file  Stress: Not on file  Social Connections: Not on file     Family History: The patient's family history includes Kidney failure in her father; Liver cancer in her mother. There is no history of Breast cancer.  ROS:   Please see the history of present illness.     All other systems reviewed and are negative.  EKGs/Labs/Other Studies Reviewed:    The following studies were reviewed today:  Echo 09/2019 1. Left ventricular ejection fraction, by estimation, is 60 to 65%. The  left ventricle has normal function. The left ventricle has no regional  wall motion abnormalities. Left ventricular diastolic parameters are  consistent with Grade I diastolic  dysfunction (impaired relaxation).   2. Right ventricular systolic function is normal. The right ventricular  size is normal. There is moderately elevated pulmonary artery systolic  pressure. The estimated right ventricular systolic pressure is [id number] mmHg.   3. Left atrial size was mildly  dilated.   4. The mitral valve is normal in structure. Mild mitral valve  regurgitation.   MPI 2020 Narrative & Impression  Normal pharmacologic myocardial perfusion stress test without significant ischemia or scar. The left ventricular ejection fraction is normal (>65%). This is a low risk study.     EKG:  EKG is  ordered today.  The ekg ordered today demonstrates NSR, 69bpm, 1st degree AV block, PAC, PRI 229ms, no ST/T wave changes  Recent Labs: 10/04/2020: ALT 15; BUN 49; Creatinine, Ser 2.08; Hemoglobin 13.0; Platelets 190; Potassium 3.5; Sodium 138  Recent Lipid Panel No results found for: CHOL, TRIG, HDL, CHOLHDL, VLDL, LDLCALC, LDLDIRECT    Physical Exam:    VS:  BP 120/74 (BP Location: Left Arm, Patient Position: Sitting, Cuff Size: Large)  Pulse 69    Ht 5\' 9"  (1.753 m)    Wt 285 lb (129.3 kg)    SpO2 92%    BMI 42.09 kg/m     Wt Readings from Last 3 Encounters:  05/15/21 285 lb (129.3 kg)  10/04/20 288 lb (130.6 kg)  09/04/20 283 lb 8 oz (128.6 kg)     GEN:  Well nourished, well developed in no acute distress HEENT: Normal NECK: No JVD; No carotid bruits LYMPHATICS: No lymphadenopathy CARDIAC: RRR, no murmurs, rubs, gallops RESPIRATORY:  Clear to auscultation without rales, wheezing or rhonchi  ABDOMEN: Soft, non-tender, non-distended MUSCULOSKELETAL:  No edema; No deformity  SKIN: Warm and dry NEUROLOGIC:  Alert and oriented x 3 PSYCHIATRIC:  Normal affect   ASSESSMENT:    1. Paroxysmal atrial fibrillation (HCC)   2. Chronic diastolic heart failure ([place])   3. Essential hypertension   4. Pulmonary hypertension (HCC)   5. Paroxysmal A-fib (HCC)   6. Stage 3 chronic kidney disease, unspecified whether stage 3a or 3b CKD (HCC)    PLAN:    In order of problems listed above:  Chronic diastolic heart failure Pulmonary HTN Echo 2021 showed normal LVEF and G1DD, mild MR. She reports chronic unchanged dyspnea on exertion. She is euvolemic on exam. Continue  current lasix 20mg  dose. Continue Losartan 100mg  daily and Toprol-XL 100mg  daily.   HTN BP is good today, continue current medications.   CKD stage 3 She follows with nephrology. Most recent labs reviewed, Scr 2.25/BUN 39 which is around baseline.   Previous episodes of afib She is not on anticoagulation given low afib burden with no recurrence. EKG today showed NSR with stable 1st degree AV block. Continue Aspirin 81mg  daily  GERD Continue PPI.   Disposition: Follow up in 6 month(s) with MD/APP     Signed, Knute Mazzuca Ninfa Meeker, PA-C  05/15/2021 11:03 AM    [facility]

## 2021-05-15 ENCOUNTER — Other Ambulatory Visit: Payer: Self-pay

## 2021-05-15 ENCOUNTER — Encounter: Payer: Self-pay | Admitting: Medical

## 2021-05-15 ENCOUNTER — Ambulatory Visit: Payer: Medicare HMO | Admitting: Medical

## 2021-05-15 VITALS — BP 120/74 | HR 69 | Ht 69.0 in | Wt 285.0 lb

## 2021-05-15 DIAGNOSIS — I10 Essential (primary) hypertension: Secondary | ICD-10-CM

## 2021-05-15 DIAGNOSIS — J8410 Pulmonary fibrosis, unspecified: Secondary | ICD-10-CM | POA: Diagnosis not present

## 2021-05-15 DIAGNOSIS — N183 Chronic kidney disease, stage 3 unspecified: Secondary | ICD-10-CM

## 2021-05-15 DIAGNOSIS — I5032 Chronic diastolic (congestive) heart failure: Secondary | ICD-10-CM

## 2021-05-15 DIAGNOSIS — I480 Paroxysmal atrial fibrillation: Secondary | ICD-10-CM | POA: Diagnosis not present

## 2021-05-15 DIAGNOSIS — I272 Pulmonary hypertension, unspecified: Secondary | ICD-10-CM | POA: Diagnosis not present

## 2021-05-15 DIAGNOSIS — Z6841 Body Mass Index (BMI) 40.0 and over, adult: Secondary | ICD-10-CM | POA: Diagnosis not present

## 2021-05-15 NOTE — Patient Instructions (Signed)

## 2021-05-30 LAB — LIPID PROFILE (EXT)
Chol/HDL Ratio (EXT): 3.4 (calc) (ref <5.0)
Cholesterol (EXT): 173 mg/dL (ref <200)
HDL Cholesterol (EXT): 51 mg/dL (ref >=50)
LDL Cholesterol, CALC (EXT): 101 mg/dL — ABNORMAL HIGH
NON HDL Cholesterol (EXT): 122 mg/dL (ref <130)
Triglycerides (EXT): 116 mg/dL (ref <150)

## 2021-06-29 DIAGNOSIS — H524 Presbyopia: Secondary | ICD-10-CM | POA: Diagnosis not present

## 2021-06-29 DIAGNOSIS — Z961 Presence of intraocular lens: Secondary | ICD-10-CM | POA: Diagnosis not present

## 2021-10-05 ENCOUNTER — Ambulatory Visit
Admission: RE | Admit: 2021-10-05 | Discharge: 2021-10-05 | Disposition: A | Discharge status: Home / Self Care | Payer: Medicare HMO | Source: Ambulatory Visit | Attending: Acute Care | Admitting: Acute Care

## 2021-10-05 DIAGNOSIS — Z87891 Personal history of nicotine dependence: Secondary | ICD-10-CM | POA: Diagnosis not present

## 2021-10-06 ENCOUNTER — Other Ambulatory Visit: Payer: Self-pay

## 2021-10-06 DIAGNOSIS — Z122 Encounter for screening for malignant neoplasm of respiratory organs: Secondary | ICD-10-CM

## 2021-10-06 DIAGNOSIS — Z87891 Personal history of nicotine dependence: Secondary | ICD-10-CM

## 2021-10-09 DIAGNOSIS — Z1331 Encounter for screening for depression: Secondary | ICD-10-CM | POA: Diagnosis not present

## 2021-10-09 DIAGNOSIS — Z1389 Encounter for screening for other disorder: Secondary | ICD-10-CM | POA: Diagnosis not present

## 2021-10-09 DIAGNOSIS — I272 Pulmonary hypertension, unspecified: Secondary | ICD-10-CM | POA: Diagnosis not present

## 2021-10-09 DIAGNOSIS — N183 Chronic kidney disease, stage 3 unspecified: Secondary | ICD-10-CM | POA: Diagnosis not present

## 2021-10-09 DIAGNOSIS — R053 Chronic cough: Secondary | ICD-10-CM | POA: Diagnosis not present

## 2021-10-09 DIAGNOSIS — I5030 Unspecified diastolic (congestive) heart failure: Secondary | ICD-10-CM | POA: Diagnosis not present

## 2021-10-09 DIAGNOSIS — J441 Chronic obstructive pulmonary disease with (acute) exacerbation: Secondary | ICD-10-CM | POA: Diagnosis not present

## 2021-10-09 DIAGNOSIS — Z0131 Encounter for examination of blood pressure with abnormal findings: Secondary | ICD-10-CM | POA: Diagnosis not present

## 2021-10-09 DIAGNOSIS — Z0130 Encounter for examination of blood pressure without abnormal findings: Secondary | ICD-10-CM | POA: Diagnosis not present

## 2021-10-26 ENCOUNTER — Other Ambulatory Visit: Payer: Self-pay | Admitting: Family Medicine

## 2021-10-26 DIAGNOSIS — Z1231 Encounter for screening mammogram for malignant neoplasm of breast: Secondary | ICD-10-CM

## 2021-11-10 ENCOUNTER — Encounter: Payer: Self-pay | Admitting: *Deleted

## 2021-11-19 ENCOUNTER — Ambulatory Visit: Payer: Medicare HMO | Admitting: Cardiovascular Disease

## 2021-11-19 NOTE — Progress Notes (Deleted)
Cardiology Office Note   Date:  11/19/2021   ID:  Khalis, Hittle 1946-02-17, MRN [id number]  PCP:  [facility]  Cardiologist:   Lorine Bears, MD   No chief complaint on file.      History of Present Illness: Holly Townsend is a 76 y.o. female who is here today for follow-up visit regarding chronic diastolic heart failure with pulmonary hypertension. She has known history of paroxysmal atrial fibrillation, sleep apnea, obesity, stage III chronic kidney disease, essential hypertension and previous tobacco use. She was diagnosed with atrial fibrillation in 2013 in the setting of decongestants use.  She converted spontaneously to sinus rhythm.  No recurrent atrial fibrillation since then and thus she has not been anticoagulated due to low burden.  CT scan of the lungs for cancer screening in 2019 showed no evidence of coronary atherosclerosis.  However, it did show evidence of cardiomegaly and enlarged pulmonary artery.  There was evidence of emphysema on CT scan. Echocardiogram in March 2020 showed normal LV systolic function with moderate to severe pulmonary hypertension with estimated systolic pulmonary pressure of 63 mmHg.  There was mild to moderate tricuspid regurgitation.  Lexiscan Myoview showed no evidence of ischemia.  She improved with diuresis.  Repeat echocardiogram in July 2021 showed normal LV systolic function with mild to moderate pulmonary hypertension.  Peak systolic pulmonary pressure was 48 mmHg.  She has been stable overall with no recent chest pain.Marland Kitchen  Describes stable exertional dyspnea.  She has mild bilateral leg edema.  Metolazone was decreased from 3 times per week to 2 times per week by nephrology.  Renal function is stable overall.    Past Medical History:  Diagnosis Date   (HFpEF) heart failure with preserved ejection fraction (HCC)    Allergic rhinitis    CKD (chronic kidney disease) stage 3, GFR 30-59 ml/min (HCC)     Colon polyps    COPD (chronic obstructive pulmonary disease) (HCC)    Hypertension    Osteoarthritis    right knee   Paroxysmal atrial fibrillation (HCC)    Pulmonary fibrosis (HCC)     Past Surgical History:  Procedure Laterality Date   BREAST EXCISIONAL BIOPSY Left 1965   NEG   CATARACT EXTRACTION, BILATERAL     COLONOSCOPY WITH PROPOFOL N/A 05/30/2019   Procedure: COLONOSCOPY WITH PROPOFOL;  Surgeon: Toledo, Boykin Nearing, MD;  Location: [facility] ENDOSCOPY;  Service: Gastroenterology;  Laterality: N/A;   EYE SURGERY  2017   detached retina   VAGINAL HYSTERECTOMY       Current Outpatient Medications  Medication Sig Dispense Refill   Acetaminophen (TYLENOL ARTHRITIS PAIN PO) Take by mouth 2 (two) times daily.      albuterol (VENTOLIN HFA) 108 (90 Base) MCG/ACT inhaler Inhale 1-2 puffs into the lungs as needed.      aspirin 81 MG tablet Take 81 mg by mouth daily.     cetirizine (ZYRTEC) 10 MG tablet Take 10 mg by mouth daily.     Cholecalciferol (VITAMIN D PO) Take 1,000 Int'l Units by mouth daily.      Cyanocobalamin (VITAMIN B12 PO) Take by mouth.     esomeprazole (NEXIUM) 40 MG capsule Take 40 mg by mouth daily.     fluticasone (FLONASE) 50 MCG/ACT nasal spray Place 1 spray into the nose as needed.      furosemide (LASIX) 20 MG tablet Take 20 mg by mouth daily.     HYDROcodone-Chlorpheniramine 5-4 MG/5ML SOLN Take  5 mLs by mouth at bedtime as needed.     losartan (COZAAR) 100 MG tablet Take 100 mg by mouth daily.      metolazone (ZAROXOLYN) 2.5 MG tablet Take 2.5 mg by mouth 2 (two) times a week. Monday and Friday.     metoprolol succinate (TOPROL-XL) 100 MG 24 hr tablet Take 100 mg by mouth daily.      omeprazole (PRILOSEC) 20 MG capsule Take 20 mg by mouth daily.     potassium chloride SA (K-DUR) 20 MEQ tablet Take 1 tablet (20 mEq total) by mouth daily. 90 tablet 3   SPIRIVA HANDIHALER 18 MCG inhalation capsule 1 capsule daily.     No current facility-administered  medications for this visit.    Allergies:   Penicillins, Statins, Pravastatin sodium, and Sulfamethoxazole-trimethoprim    Social History:  The patient  reports that she quit smoking about 14 years ago. Her smoking use included cigarettes. She has a 70.50 pack-year smoking history. She has never used smokeless tobacco. She reports that she does not drink alcohol and does not use drugs.   Family History:  The patient's family history includes Kidney failure in her father; Liver cancer in her mother.    ROS:  Please see the history of present illness.   Otherwise, review of systems are positive for none.   All other systems are reviewed and negative.    PHYSICAL EXAM: VS:  There were no vitals taken for this visit. , BMI There is no height or weight on file to calculate BMI. GEN: Well nourished, well developed, in no acute distress  HEENT: normal  Neck: Jugular venous pressure is not well visualized, carotid bruits, or masses Cardiac: RRR; no murmurs, rubs, or gallops, mild  bilateral leg edema Respiratory:  clear to auscultation bilaterally, normal work of breathing GI: soft, nontender, nondistended, + BS MS: no deformity or atrophy  Skin: warm and dry, no rash Neuro:  Strength and sensation are intact Psych: euthymic mood, full affect   EKG:  EKG is ordered today. The ekg ordered today demonstrates sinus rhythm with first-degree AV block.  No significant ST changes.   Recent Labs: No results found for requested labs within last 365 days.    Lipid Panel No results found for: "CHOL", "TRIG", "HDL", "CHOLHDL", "VLDL", "LDLCALC", "LDLDIRECT"    Wt Readings from Last 3 Encounters:  05/15/21 285 lb (129.3 kg)  10/04/20 288 lb (130.6 kg)  09/04/20 283 lb 8 oz (128.6 kg)          05/18/2018    9:30 AM  PAD Screen  Previous PAD dx? No  Previous surgical procedure? No  Pain with walking? No  Feet/toe relief with dangling? No  Painful, non-healing ulcers? No  Extremities  discolored? No      ASSESSMENT AND PLAN:  1.  Chronic diastolic heart failure with pulmonary hypertension: She seems to be stable and euvolemic on current diuretic dose.  Renal function has been stable with most recent creatinine of 2.27.    Continue furosemide 40 mg daily and metolazone 2.5 mg 2 times weekly.    3.  Essential hypertension: Blood pressure is well controlled on current medications  3.  Chronic kidney disease: Followed by [facility] nephrology.  4.  Previous episode of atrial fibrillation in the setting of decongestant use with no recurrent episodes.  No need for anticoagulation unless she develops recurrent episodes.  5.  GERD: Improved with Nexium.  Cardiac work-up has been negative so far.  Disposition:   FU with me in 6 months  Signed,  Kathlyn Sacramento, MD  11/19/2021 7:57 AM    [place]

## 2021-11-26 ENCOUNTER — Ambulatory Visit: Payer: Medicare HMO | Attending: Cardiovascular Disease | Admitting: Cardiovascular Disease

## 2021-11-26 ENCOUNTER — Encounter: Payer: Self-pay | Admitting: Cardiovascular Disease

## 2021-11-26 VITALS — BP 128/60 | HR 67 | Ht 69.0 in | Wt 290.2 lb

## 2021-11-26 DIAGNOSIS — I10 Essential (primary) hypertension: Secondary | ICD-10-CM | POA: Diagnosis not present

## 2021-11-26 DIAGNOSIS — I5032 Chronic diastolic (congestive) heart failure: Secondary | ICD-10-CM | POA: Diagnosis not present

## 2021-11-26 DIAGNOSIS — R0602 Shortness of breath: Secondary | ICD-10-CM | POA: Diagnosis not present

## 2021-11-26 DIAGNOSIS — K219 Gastro-esophageal reflux disease without esophagitis: Secondary | ICD-10-CM

## 2021-11-26 DIAGNOSIS — I480 Paroxysmal atrial fibrillation: Secondary | ICD-10-CM

## 2021-11-26 NOTE — Progress Notes (Signed)
Cardiology Office Note   Date:  11/26/2021   ID:  Holly, Townsend 09-Nov-1945, MRN [id number]  PCP:  [facility]  Cardiologist:   Lorine Bears, MD   Chief Complaint  Patient presents with   Other    6 month f/u c/o sob. Meds reviewed verbally with pt.       History of Present Illness: Holly Townsend is a 76 y.o. female who is here today for follow-up visit regarding chronic diastolic heart failure with pulmonary hypertension. She has known history of paroxysmal atrial fibrillation, sleep apnea, obesity, stage III chronic kidney disease, essential hypertension and previous tobacco use. She was diagnosed with atrial fibrillation in 2013 in the setting of decongestants use.  She converted spontaneously to sinus rhythm.  No recurrent atrial fibrillation since then and thus she has not been anticoagulated due to low burden.  CT scan of the lungs for cancer screening in 2019 showed no evidence of coronary atherosclerosis.  However, it did show evidence of cardiomegaly and enlarged pulmonary artery.  There was evidence of emphysema on CT scan. Echocardiogram in March 2020 showed normal LV systolic function with moderate to severe pulmonary hypertension with estimated systolic pulmonary pressure of 63 mmHg.  There was mild to moderate tricuspid regurgitation.  Lexiscan Myoview showed no evidence of ischemia.  She improved with diuresis.  Repeat echocardiogram in July 2021 showed normal LV systolic function with mild to moderate pulmonary hypertension.  Peak systolic pulmonary pressure was 48 mmHg.  Since her last visit, she reports worsening exertional dyspnea.  She has chronic chest pain related to GERD that has not changed.  There is mild lower extremity edema.  She continues to take furosemide 20 mg once daily and metolazone twice a week.  Her weight is slightly higher than what it was in February by 5 pounds.    Past Medical History:  Diagnosis  Date   (HFpEF) heart failure with preserved ejection fraction (HCC)    Allergic rhinitis    CKD (chronic kidney disease) stage 3, GFR 30-59 ml/min (HCC)    Colon polyps    COPD (chronic obstructive pulmonary disease) (HCC)    Hypertension    Osteoarthritis    right knee   Paroxysmal atrial fibrillation (HCC)    Pulmonary fibrosis (HCC)     Past Surgical History:  Procedure Laterality Date   BREAST EXCISIONAL BIOPSY Left 1965   NEG   CATARACT EXTRACTION, BILATERAL     COLONOSCOPY WITH PROPOFOL N/A 05/30/2019   Procedure: COLONOSCOPY WITH PROPOFOL;  Surgeon: Toledo, Boykin Nearing, MD;  Location: [facility] ENDOSCOPY;  Service: Gastroenterology;  Laterality: N/A;   EYE SURGERY  2017   detached retina   VAGINAL HYSTERECTOMY       Current Outpatient Medications  Medication Sig Dispense Refill   Acetaminophen (TYLENOL ARTHRITIS PAIN PO) Take by mouth 2 (two) times daily.      albuterol (VENTOLIN HFA) 108 (90 Base) MCG/ACT inhaler Inhale 1-2 puffs into the lungs as needed.      aspirin 81 MG tablet Take 81 mg by mouth daily.     cetirizine (ZYRTEC) 10 MG tablet Take 10 mg by mouth daily.     Cholecalciferol (VITAMIN D PO) Take 1,000 Int'l Units by mouth daily.      Cyanocobalamin (VITAMIN B12 PO) Take by mouth.     esomeprazole (NEXIUM) 40 MG capsule Take 40 mg by mouth daily.     fluticasone (FLONASE) 50 MCG/ACT nasal spray  Place 1 spray into the nose as needed.      furosemide (LASIX) 20 MG tablet Take 20 mg by mouth daily.     HYDROcodone-Chlorpheniramine 5-4 MG/5ML SOLN Take 5 mLs by mouth at bedtime as needed.     losartan (COZAAR) 100 MG tablet Take 100 mg by mouth daily.      metolazone (ZAROXOLYN) 2.5 MG tablet Take 2.5 mg by mouth 2 (two) times a week. Monday and Friday.     metoprolol succinate (TOPROL-XL) 100 MG 24 hr tablet Take 100 mg by mouth daily.      omeprazole (PRILOSEC) 20 MG capsule Take 20 mg by mouth daily.     potassium chloride SA (K-DUR) 20 MEQ tablet Take 1 tablet  (20 mEq total) by mouth daily. 90 tablet 3   SPIRIVA HANDIHALER 18 MCG inhalation capsule 1 capsule daily.     No current facility-administered medications for this visit.    Allergies:   Penicillins, Statins, Pravastatin sodium, and Sulfamethoxazole-trimethoprim    Social History:  The patient  reports that she quit smoking about 14 years ago. Her smoking use included cigarettes. She has a 70.50 pack-year smoking history. She has never used smokeless tobacco. She reports that she does not drink alcohol and does not use drugs.   Family History:  The patient's family history includes Kidney failure in her father; Liver cancer in her mother.    ROS:  Please see the history of present illness.   Otherwise, review of systems are positive for none.   All other systems are reviewed and negative.    PHYSICAL EXAM: VS:  BP 128/60 (BP Location: Left Arm, Patient Position: Sitting, Cuff Size: Large)   Pulse 67   Ht 5\' 9"  (1.753 m)   Wt 290 lb 4 oz (131.7 kg)   SpO2 96%   BMI 42.86 kg/m  , BMI Body mass index is 42.86 kg/m. GEN: Well nourished, well developed, in no acute distress  HEENT: normal  Neck: Jugular venous pressure is not well visualized. No carotid bruits, or masses Cardiac: RRR; no murmurs, rubs, or gallops, mild  bilateral leg edema Respiratory:  clear to auscultation bilaterally, normal work of breathing GI: soft, nontender, nondistended, + BS MS: no deformity or atrophy  Skin: warm and dry, no rash Neuro:  Strength and sensation are intact Psych: euthymic mood, full affect   EKG:  EKG is ordered today. The ekg ordered today demonstrates sinus rhythm with marked sinus arrhythmia and first-degree AV block.  Low voltage.  Recent Labs: No results found for requested labs within last 365 days.    Lipid Panel No results found for: "CHOL", "TRIG", "HDL", "CHOLHDL", "VLDL", "LDLCALC", "LDLDIRECT"    Wt Readings from Last 3 Encounters:  11/26/21 290 lb 4 oz (131.7 kg)   05/15/21 285 lb (129.3 kg)  10/04/20 288 lb (130.6 kg)          05/18/2018    9:30 AM  PAD Screen  Previous PAD dx? No  Previous surgical procedure? No  Pain with walking? No  Feet/toe relief with dangling? No  Painful, non-healing ulcers? No  Extremities discolored? No      ASSESSMENT AND PLAN:  1.  Chronic diastolic heart failure with pulmonary hypertension: She reports worsening exertional dyspnea and her weight is slightly above her previous weight.  I requested an echocardiogram to evaluate her diastolic function and pulmonary pressure.  Renal function remained stable with most recent creatinine of 2.2.  She is only on  small dose furosemide 20 mg daily but reports good urine output.  We might need to adjust the dose based on the results of echocardiogram.  3.  Essential hypertension: Blood pressure is well controlled on current medications  3.  Chronic kidney disease: Followed by [facility] nephrology.  4.  Previous episode of atrial fibrillation in the setting of decongestant use with no recurrent episodes.  No need for anticoagulation unless she develops recurrent episodes.  5.  GERD: Symptoms are stable with PPI.    Disposition:   FU with me in 6 months  Signed,  Lorine Bears, MD  11/26/2021 8:53 AM    [facility]

## 2021-11-26 NOTE — Patient Instructions (Signed)
Medication Instructions:  Your physician recommends that you continue on your current medications as directed. Please refer to the Current Medication list given to you today.  *If you need a refill on your cardiac medications before your next appointment, please call your pharmacy*   Lab Work: None ordered If you have labs (blood work) drawn today and your tests are completely normal, you will receive your results only by: MyChart Message (if you have MyChart) OR A paper copy in the mail If you have any lab test that is abnormal or we need to change your treatment, we will call you to review the results.   Testing/Procedures: Your physician has requested that you have an echocardiogram. Echocardiography is a painless test that uses sound waves to create images of your heart. It provides your doctor with information about the size and shape of your heart and how well your heart's chambers and valves are working. This procedure takes approximately one hour. There are no restrictions for this procedure.    Follow-Up: At [facility], you and your health needs are our priority.  As part of our continuing mission to provide you with exceptional heart care, we have created designated Provider Care Teams.  These Care Teams include your primary Cardiologist (physician) and Advanced Practice Providers (APPs -  Physician Assistants and Nurse Practitioners) who all work together to provide you with the care you need, when you need it.  We recommend signing up for the patient portal called "MyChart".  Sign up information is provided on this After Visit Summary.  MyChart is used to connect with patients for Virtual Visits (Telemedicine).  Patients are able to view lab/test results, encounter notes, upcoming appointments, etc.  Non-urgent messages can be sent to your provider as well.   To learn more about what you can do with MyChart, go to [email or web address].    Your next appointment:   6  month(s)  The format for your next appointment:   In Person  Provider:   You may see Muhammad Arida, MD or one of the following Advanced Practice Providers on your designated Care Team:   Christopher Berge, NP [name] Dunn, PA-C Cadence Furth, PA-C Sheri Hammock, NP   Other Instructions N/A  Important Information About Sugar       

## 2021-12-02 ENCOUNTER — Ambulatory Visit
Admission: RE | Admit: 2021-12-02 | Discharge: 2021-12-02 | Disposition: A | Discharge status: Home / Self Care | Payer: Medicare HMO | Source: Ambulatory Visit | Attending: Family Medicine | Admitting: Family Medicine

## 2021-12-02 DIAGNOSIS — Z1231 Encounter for screening mammogram for malignant neoplasm of breast: Secondary | ICD-10-CM | POA: Diagnosis not present

## 2022-01-06 ENCOUNTER — Ambulatory Visit: Payer: Medicare HMO | Attending: Cardiovascular Disease

## 2022-01-06 DIAGNOSIS — R0602 Shortness of breath: Secondary | ICD-10-CM

## 2022-01-06 LAB — ECHOCARDIOGRAM COMPLETE
AR max vel: 3.61 cm2
AV Area VTI: 3.64 cm2
AV Area mean vel: 3.38 cm2
AV Mean grad: 5 mmHg
AV Peak grad: 10 mmHg
Ao pk vel: 1.58 m/s
Area-P 1/2: 4.46 cm2
Calc EF: 54.2 %
Single Plane A2C EF: 54.1 %
Single Plane A4C EF: 53.8 %

## 2022-02-02 DIAGNOSIS — J441 Chronic obstructive pulmonary disease with (acute) exacerbation: Secondary | ICD-10-CM | POA: Diagnosis not present

## 2022-05-06 ENCOUNTER — Ambulatory Visit: Admit: 2022-05-06 | Discharge: 2022-05-07 | Payer: MEDICARE | Attending: Specialist | Primary: Family Medicine

## 2022-05-06 DIAGNOSIS — M75121 Complete rotator cuff tear or rupture of right shoulder, not specified as traumatic: Secondary | ICD-10-CM

## 2022-05-06 MED ORDER — methylPREDNISolone acetate (DEPO-Medrol) injection 40 mg
40 | Freq: Once | INTRAMUSCULAR | Status: AC | PRN
Start: 2022-05-06 — End: 2022-05-06
  Administered 2022-05-06: 17:00:00 40 mg via INTRA_ARTICULAR

## 2022-05-06 MED ORDER — lidocaine (Xylocaine) 10 mg/mL (1 %) injection 8 mL
10 | Freq: Once | INTRAMUSCULAR | Status: AC | PRN
Start: 2022-05-06 — End: 2022-05-06
  Administered 2022-05-06: 17:00:00 8 mL via INTRAMUSCULAR

## 2022-05-06 NOTE — Progress Notes (Signed)
[address]  [place], [place] [address]    [address]  [place], [place] [address]    Phone: [phone]  Fax:      [phone]  E-mail: [email or web address]   Taylor Christensen is seen in follow-up for her right shoulder difficulties.  Briefly, she has what is suspected to be an acute on chronic rotator cuff tear, has done well with a cortisone injection in the past, her last perhaps up to 3 years ago. She continues to be uninterested in any surgical options, has been able to continue working out carefully in the gym.  She is taking an occasional Advil.    Exam: She has atrophy in the supraspinatus and infraspinatus fossa, is minimally tender anterolaterally, mildly weakened scaption.    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    Procedure:  Taylor Christensen is counseled for an ultrasound-guided injection of her right shoulder after discussing the risks, alternatives and potential outcomes. Taylor Christensen acknowledges these, and gives consent for performance ofher cortisone injection of her right shoulder. With her seated, the skin of the right shoulder was prepped with ChloraPrep, and the [employer] ultrasound machine used to identify the posterior aspect of the glenohumeral joint in the transverse plane. A 22-gauge needle was then advanced in plane into the joint, with 10 cc of Xylocaine and 40 mg Depo-Medrol then injected. She tolerated this well, with her pain markedly diminished. Taylor Christensen will follow up in 6 weeks for reevaluation and possible repeat injection.      L Inj/Asp: R glenohumeral on 05/06/2022 11:42 AM  Indications: pain  Details: 22 G needle, ultrasound-guided posterior approach  Medications: 40 mg methylPREDNISolone acetate 40 mg/mL; 8 mL lidocaine 10 mg/mL (1 %)

## 2022-05-27 ENCOUNTER — Encounter: Payer: Self-pay | Admitting: Cardiovascular Disease

## 2022-05-27 ENCOUNTER — Ambulatory Visit: Payer: Medicare HMO | Attending: Cardiovascular Disease | Admitting: Cardiovascular Disease

## 2022-05-27 VITALS — BP 138/70 | HR 66 | Ht 69.0 in | Wt 285.6 lb

## 2022-05-27 DIAGNOSIS — I272 Pulmonary hypertension, unspecified: Secondary | ICD-10-CM | POA: Diagnosis not present

## 2022-05-27 DIAGNOSIS — I5032 Chronic diastolic (congestive) heart failure: Secondary | ICD-10-CM | POA: Diagnosis not present

## 2022-05-27 DIAGNOSIS — K219 Gastro-esophageal reflux disease without esophagitis: Secondary | ICD-10-CM

## 2022-05-27 DIAGNOSIS — I10 Essential (primary) hypertension: Secondary | ICD-10-CM

## 2022-05-27 NOTE — Patient Instructions (Signed)
Medication Instructions:  No changes *If you need a refill on your cardiac medications before your next appointment, please call your pharmacy*   Lab Work: None ordered If you have labs (blood work) drawn today and your tests are completely normal, you will receive your results only by: [place] (if you have MyChart) OR A paper copy in the mail If you have any lab test that is abnormal or we need to change your treatment, we will call you to review the results.   Testing/Procedures: None ordered   Follow-Up: At [facility], you and your health needs are our priority.  As part of our continuing mission to provide you with exceptional heart care, we have created designated Provider Care Teams.  These Care Teams include your primary Cardiologist (physician) and Advanced Practice Providers (APPs -  Physician Assistants and Nurse Practitioners) who all work together to provide you with the care you need, when you need it.  We recommend signing up for the patient portal called "MyChart".  Sign up information is provided on this After Visit Summary.  MyChart is used to connect with patients for Virtual Visits (Telemedicine).  Patients are able to view lab/test results, encounter notes, upcoming appointments, etc.  Non-urgent messages can be sent to your provider as well.   To learn more about what you can do with MyChart, go to [email or web address].    Your next appointment:   12 month(s)  Provider:   You may see Kathlyn Sacramento, MD or one of the following Advanced Practice Providers on your designated Care Team:   Murray Hodgkins, NP Christell Faith, PA-C Cadence Kathlen Mody, PA-C Gerrie Nordmann, NP

## 2022-05-27 NOTE — Progress Notes (Signed)
Cardiology Office Note   Date:  05/27/2022   ID:  [name], [name] July 20, 1945, MRN [id number]  PCP:  [facility]  Cardiologist:   Kathlyn Sacramento, MD   Chief Complaint  Patient presents with   Follow-up    Patient states that she is short winded from walking. Meds reviewed.        History of Present Illness: Holly Townsend is a 77 y.o. female who is here today for follow-up visit regarding chronic diastolic heart failure with pulmonary hypertension. She has known history of paroxysmal atrial fibrillation, sleep apnea, obesity, stage III chronic kidney disease, essential hypertension and previous tobacco use. She was diagnosed with atrial fibrillation in 2013 in the setting of decongestants use.  She converted spontaneously to sinus rhythm.  No recurrent atrial fibrillation since then and thus she has not been anticoagulated due to low burden.  CT scan of the lungs for cancer screening in 2019 showed no evidence of coronary atherosclerosis.  However, it did show evidence of cardiomegaly and enlarged pulmonary artery.  There was evidence of emphysema on CT scan. Echocardiogram in March 2020 showed normal LV systolic function with moderate to severe pulmonary hypertension with estimated systolic pulmonary pressure of 63 mmHg.  There was mild to moderate tricuspid regurgitation.  Lexiscan Myoview showed no evidence of ischemia.  She improved with diuresis.  Repeat echocardiogram in July 2021 showed normal LV systolic function with mild to moderate pulmonary hypertension.  Peak systolic pulmonary pressure was 48 mmHg.   She has chronic chest pain related to GERD that has not changed.  There is mild lower extremity edema.   She reported worsening exertional dyspnea during last visit and thus an echocardiogram was repeated in October 2023 which showed an EF of 55 to 123456, grade 1 diastolic dysfunction, mild mitral regurgitation and indeterminate pulmonary  pressure.  She has been doing reasonably well with stable exertional dyspnea.  No chest pain.  She saw her nephrologist yesterday and renal function was stable with a creatinine of 2.3 and GFR of 21.    Past Medical History:  Diagnosis Date   (HFpEF) heart failure with preserved ejection fraction (HCC)    Allergic rhinitis    CKD (chronic kidney disease) stage 3, GFR 30-59 ml/min (HCC)    Colon polyps    COPD (chronic obstructive pulmonary disease) (HCC)    Hypertension    Osteoarthritis    right knee   Paroxysmal atrial fibrillation (HCC)    Pulmonary fibrosis (HCC)     Past Surgical History:  Procedure Laterality Date   BREAST EXCISIONAL BIOPSY Left 1965   NEG   CATARACT EXTRACTION, BILATERAL     COLONOSCOPY WITH PROPOFOL N/A 05/30/2019   Procedure: COLONOSCOPY WITH PROPOFOL;  Surgeon: Toledo, Benay Pike, MD;  Location: [facility] ENDOSCOPY;  Service: Gastroenterology;  Laterality: N/A;   EYE SURGERY  2017   detached retina   VAGINAL HYSTERECTOMY       Current Outpatient Medications  Medication Sig Dispense Refill   Acetaminophen (TYLENOL ARTHRITIS PAIN PO) Take by mouth 2 (two) times daily.      albuterol (VENTOLIN HFA) 108 (90 Base) MCG/ACT inhaler Inhale 1-2 puffs into the lungs as needed.      aspirin 81 MG tablet Take 81 mg by mouth daily.     cetirizine (ZYRTEC) 10 MG tablet Take 10 mg by mouth daily.     Cholecalciferol (VITAMIN D PO) Take 1,000 Int'l Units by mouth daily.  Cyanocobalamin (VITAMIN B12 PO) Take 1 tablet by mouth daily.     esomeprazole (NEXIUM) 40 MG capsule Take 40 mg by mouth daily.     fluticasone (FLONASE) 50 MCG/ACT nasal spray Place 1 spray into the nose as needed.      furosemide (LASIX) 20 MG tablet Take 20 mg by mouth daily.     HYDROcodone-Chlorpheniramine 5-4 MG/5ML SOLN Take 5 mLs by mouth at bedtime as needed.     losartan (COZAAR) 100 MG tablet Take 100 mg by mouth daily.      metolazone (ZAROXOLYN) 2.5 MG tablet Take 2.5 mg by mouth 2  (two) times a week. Monday and Friday.     metoprolol succinate (TOPROL-XL) 100 MG 24 hr tablet Take 100 mg by mouth daily.      omeprazole (PRILOSEC) 20 MG capsule Take 20 mg by mouth daily.     potassium chloride SA (K-DUR) 20 MEQ tablet Take 1 tablet (20 mEq total) by mouth daily. 90 tablet 3   SPIRIVA HANDIHALER 18 MCG inhalation capsule 1 capsule daily.     No current facility-administered medications for this visit.    Allergies:   Penicillin g, Penicillins, Statins, Pravastatin sodium, and Sulfamethoxazole-trimethoprim    Social History:  The patient  reports that she quit smoking about 15 years ago. Her smoking use included cigarettes. She has a 70.50 pack-year smoking history. She has never used smokeless tobacco. She reports that she does not drink alcohol and does not use drugs.   Family History:  The patient's family history includes Kidney failure in her father; Liver cancer in her mother.    ROS:  Please see the history of present illness.   Otherwise, review of systems are positive for none.   All other systems are reviewed and negative.    PHYSICAL EXAM: VS:  BP 138/70 (BP Location: Left Arm, Patient Position: Sitting, Cuff Size: Large)   Pulse 66   Ht '5\' 9"'$  (1.753 m)   Wt 285 lb 9.6 oz (129.5 kg)   SpO2 91%   BMI 42.18 kg/m  , BMI Body mass index is 42.18 kg/m. GEN: Well nourished, well developed, in no acute distress  HEENT: normal  Neck: Jugular venous pressure is not well visualized. No carotid bruits, or masses Cardiac: RRR; no murmurs, rubs, or gallops, mild  bilateral leg edema Respiratory:  clear to auscultation bilaterally, normal work of breathing GI: soft, nontender, nondistended, + BS MS: no deformity or atrophy  Skin: warm and dry, no rash Neuro:  Strength and sensation are intact Psych: euthymic mood, full affect   EKG:  EKG is ordered today. The ekg ordered today demonstrates sinus rhythm with sinus arrhythmia and first-degree AV  block.  Recent Labs: No results found for requested labs within last 365 days.    Lipid Panel No results found for: "CHOL", "TRIG", "HDL", "CHOLHDL", "VLDL", "LDLCALC", "LDLDIRECT"    Wt Readings from Last 3 Encounters:  05/27/22 285 lb 9.6 oz (129.5 kg)  11/26/21 290 lb 4 oz (131.7 kg)  05/15/21 285 lb (129.3 kg)          05/18/2018    9:30 AM  PAD Screen  Previous PAD dx? No  Previous surgical procedure? No  Pain with walking? No  Feet/toe relief with dangling? No  Painful, non-healing ulcers? No  Extremities discolored? No      ASSESSMENT AND PLAN:  1.  Chronic diastolic heart failure with pulmonary hypertension: She seems to have stable shortness of breath  and appears to be euvolemic on current dose of furosemide and metolazone.  Here renal function remained stable and echocardiogram last year showed normal LV systolic function.  Pulmonary pressure could not be estimated but no signs of significant pulmonary hypertension.    3.  Essential hypertension: Blood pressure is well controlled on current medications  3.  Chronic kidney disease: Followed by [facility] nephrology.  4.  Previous episode of atrial fibrillation in the setting of decongestant use with no recurrent episodes.  No need for anticoagulation unless she develops recurrent episodes.  5.  GERD: Symptoms are stable with PPI.    Disposition:   FU with me in 12 months  Signed,  Kathlyn Sacramento, MD  05/27/2022 9:40 AM    [place]

## 2022-06-03 LAB — LIPID PROFILE (EXT)
Chol/HDL Ratio (EXT): 3.1 (calc) (ref <5.0)
Cholesterol (EXT): 194 mg/dL (ref <200)
HDL Cholesterol (EXT): 63 mg/dL (ref >=50)
LDL Cholesterol, CALC (EXT): 107 mg/dL — ABNORMAL HIGH
NON HDL Cholesterol (EXT): 131 mg/dL — ABNORMAL HIGH (ref <130)
Triglycerides (EXT): 125 mg/dL (ref <150)

## 2022-06-03 LAB — HEMOGLOBIN A1C: HEMOGLOBIN A1C % (INT/EXT): 5.7 %{Hb} — ABNORMAL HIGH (ref <5.7)

## 2022-06-21 LAB — COLOGUARD (EXT): COLOGUARD (EXT): POSITIVE — AB

## 2022-08-25 ENCOUNTER — Other Ambulatory Visit: Payer: Self-pay | Admitting: Acute Care

## 2022-08-25 DIAGNOSIS — Z122 Encounter for screening for malignant neoplasm of respiratory organs: Secondary | ICD-10-CM

## 2022-08-25 DIAGNOSIS — Z87891 Personal history of nicotine dependence: Secondary | ICD-10-CM

## 2022-10-04 ENCOUNTER — Other Ambulatory Visit: Payer: Self-pay | Admitting: Family Medicine

## 2022-10-04 DIAGNOSIS — Z1231 Encounter for screening mammogram for malignant neoplasm of breast: Secondary | ICD-10-CM

## 2022-10-07 ENCOUNTER — Ambulatory Visit
Admission: RE | Admit: 2022-10-07 | Discharge: 2022-10-07 | Disposition: A | Discharge status: Home / Self Care | Payer: Medicare HMO | Source: Ambulatory Visit | Attending: Family Medicine | Admitting: Family Medicine

## 2022-10-07 DIAGNOSIS — Z122 Encounter for screening for malignant neoplasm of respiratory organs: Secondary | ICD-10-CM | POA: Insufficient documentation

## 2022-10-07 DIAGNOSIS — Z87891 Personal history of nicotine dependence: Secondary | ICD-10-CM | POA: Insufficient documentation

## 2022-10-07 DIAGNOSIS — I700 Atherosclerosis of aorta: Secondary | ICD-10-CM | POA: Diagnosis not present

## 2022-10-07 DIAGNOSIS — J432 Centrilobular emphysema: Secondary | ICD-10-CM | POA: Insufficient documentation

## 2022-12-06 ENCOUNTER — Ambulatory Visit
Admission: RE | Admit: 2022-12-06 | Discharge: 2022-12-06 | Disposition: A | Discharge status: Home / Self Care | Payer: Medicare HMO | Source: Ambulatory Visit | Attending: Family Medicine | Admitting: Family Medicine

## 2022-12-06 DIAGNOSIS — Z1231 Encounter for screening mammogram for malignant neoplasm of breast: Secondary | ICD-10-CM | POA: Diagnosis present

## 2023-02-08 ENCOUNTER — Encounter: Admit: 2023-02-08 | Primary: Family Medicine

## 2023-02-08 ENCOUNTER — Ambulatory Visit: Admit: 2023-02-08 | Discharge: 2023-02-08 | Payer: MEDICARE | Attending: Specialist | Primary: Family Medicine

## 2023-02-08 DIAGNOSIS — G8929 Other chronic pain: Secondary | ICD-10-CM

## 2023-02-08 NOTE — Progress Notes (Signed)
[facility]  [address], [address]  [place] [address]  Dept: [phone]  Dept Fax: [phone]   Taylor Christensen is a 77 year old woman that presents with a history of new injury to her left shoulder.  She notes that she has had increasing limited range of motion with pain that extends from her shoulder down the arm to the elbow, began after exercising, taking Advil as needed but no distal dysesthesias.  No prior history of surgery or injury.    Exam: She has painful arc in abduction, slightly worse with belly press and resisted abduction and external rotation, although strength maintained 5/5.  Nontender over the [facility] joint, glenohumeral glide negative, sulcus sign negative.    X-ray: 3 views of the left shoulder demonstrate mild flattening of the acromion, small anterior inferior acromial arch with slight elevation of the humeral head, but a maintained glenohumeral joint and very minimal loss of the acromiohumeral distance.    Impression: Probable left shoulder rotator cuff tendinopathy given her findings    Plan: She is going to attempt a trial of therapy, and if no improvement, check in with [place] in a few weeks to consider a cortisone injection.

## 2023-02-22 ENCOUNTER — Encounter
Admit: 2023-02-22 | Discharge: 2023-02-22 | Payer: MEDICARE | Attending: Rehabilitative and Restorative Service Providers" | Primary: Family Medicine

## 2023-02-22 DIAGNOSIS — G8929 Other chronic pain: Secondary | ICD-10-CM

## 2023-02-22 NOTE — Progress Notes (Signed)
Physical Therapy Evaluation    Patient Name: Taylor Christensen  MRN: [id number]  DOB: Jul 21, 1945  Evaluation Date: 02/22/2023  Referral Diagnosis: M25.512, G89.29  Chronic left shoulder pain    Subjective     History of Present Condition/Mechanism of Injury: Taylor Christensen is a 77 y.o. female being referred to PT with left shoulder pain.  Her issue is chronic but seems to be worsening over the past 5 months.  She exercises twice a week and feels good during the day.  It tightens up when she is in bed. The right side will bother her too but not as bad. She had a cortisone injection on her right side in 04/2022 which helped significantly.      Pertinent Past Medical History/Surgical History:   Past Surgical History:   Procedure Laterality Date   APPENDECTOMY 1956   CATARACT EXTRACTION 09/2010   HYSTERECTOMY 04/1976   LITHOTRIPSY   TONSILLECTOMY   TOTAL ABDOMINAL HYSTERECTOMY W/ BILATERAL SALPINGOOPHORECTOMY     Current Outpatient Medications:   atorvastatin (LIPITOR) 20 mg tablet, Take 1 tablet (20 mg total) by mouth daily., Disp: 90 tablet, Rfl: 3  chlorhexidine (PERIDEX) 0.12 % solution, , Disp: , Rfl:   cholecalciferol, vitamin D3, 50 mcg (2,000 unit) capsule, 1 3 times weekly, Disp: , Rfl:   L.acid-L.casei-B.bif-B.lon-FOS (Probiotic Blend) 2 billion cell-50 mg capsule, , Disp: , Rfl:   multivitamin (THERAGRAN) tablet tablet, Take 1 tablet by mouth daily., Disp: 100 tablet, Rfl: 0  omega-3 fatty acids/dha/epa (OVEGA-3 ORAL), Take by mouth., Disp: , Rfl:   UNABLE TO FIND, Med Name: [facility] compound drug pregesterone 200mg  Bi-Est 2mg , Disp: , Rfl:   UNABLE TO FIND, Bi-Est 2 mg - topically apply 2 clicks 2 times a day, Disp: , Rfl:     No known allergies          General Visit Information:  Background Information  Orders/Protocol: left shoulder pain  Time of Symptoms: Chronic  Diagnostic Tests: X-ray    Current Level of Function:  Self Care: Limited/Painful  Reaching: Limited/Painful  Lifting: Limited/Painful  Transitional  Mobility: Limited/Painful  Sleeping: Limited/Painful    Home Environment:   Lives in a [facility] with sister    Pain  Pain Assessment  Pain Assessment: 0-10  Pain Score:  (0-7)  Pain Location: Shoulder  Pain Orientation: Left           Objective   Posture/Observation  Posture/Observation  Standing Posture: Forward Head, Forward Shoulders    Functional Mobility  Mild left shoulder shrug sign with active elevation    Range of Motion     Overall RUE AROM: Impaired  Overall LUE AROM: Impaired    Shoulder AROM    Right Left   Flexion 145   145     Extension 45   45       Abduction 150   130       Internal Rotation L1 T10   External Rotation C7 C7   Horizontal ABduction 20 20   Horizontal ADduction 100   80         Strength     Shoulder Strength Right Left   Flexion 4+/5 4-/5   Extension 5/5 4/5   ABduction 4/5 3+/5   Internal Rotation 5/5 4/5   External Rotation 4+/5 3+/5     Palpation  Palpation/Inspection  Palpation/Inspection: TTP @ L SA space, prox biceps; 2/3 tone- left UT, post RTC    Neuro-Vascular  Sensation Clearing:  (Intermittent tingling in the  left deltoid, brachium)    Examination  Shoulder Examination  Taylor Christensen: Left - Positive  Neer Impingement: Left - Positive  Empty Can : Left - Positive  Drop Arm: Left - Negative    Outcome Measures     OP PT General Evaluation       Row Name 02/22/23 0900          Functional Outcome Measures    Additional Tests QuickDASH        QuickDASH    Open a Tight or New Jar 2     Do Heavy Household Chores (e.g. Wash Walls, Floors) 4     Carrying a Sports administrator or Briefcase 2     [employer] Your Back 2     Use a Knife to [employer] 1     Recreational activities (Golf, Hammering, Tennis) 2     During the past week, to what extent has your arm, shoulder, or hand problem interfered with your normal social activities with family, friends, neighbors or groups? 3     During the past week, were you limited in your work or other regular daily activities as a result of your arm, shoulder or  hand problem? 3     Arm, Shoulder or Hand pain 3     Tingling (Pins and needles) in your arm, shoulder or hand) 2     During the past week, how much difficulty have you had sleeping because of the pain in your arm, shoulder or hand? 3     QuickDash Score (0=No disability, 100= Severe disablity) 36.36                     Treatment   OP PT Treatment - Tue February 22, 2023       Row Name 0900       Pain Assessment    Pain Assessment 0-10    Pain Score --  0-7    Pain Location Shoulder    Pain Orientation Left       Objective Measurements    Palpation/Inspection TTP @ L SA space, prox biceps; 2/3 tone- left UT, post RTC       Therapeutic Exercise    Exercise AAROM Flex 1x10    Exercise SDL ER    Exercise SA Punch    Exercise B Ws OTB 3x10    Exercise B Ts OTB 3x10    Exercise Rows    Exercise Extension       Manual Therapy    Manual Therapy gentle cfm- left prox biceps                    Assessment   Assessment: Patient presents to PT today with signs/sx consistent with a left rotator cuff pathology.  Key impairments include decreased ROM in all planes, reduced strength of the deltoid, rotator cuff and scapulothoracic stabilizers, decreased motor control and timing of shoulder musculature and pain when using the UE for daily and functional movements.  Skilled PT required to address these impairments.  Provided a HEP to work on limiting above impairments and encouraged not to perform any exercises > 6/10 on 0-10 pain scale.       Assessment  Rehab Potential: Good    Goals:  Active       PT LONG TERM GOAL       Independent with HEP       Start:  02/22/23    Expected End:  04/05/23  Patient will demonstrate increased shoulder flexion AROM to >150 degrees within 6 weeks in order to improve patient's ability to perform overhead ADL's.        Start:  02/22/23    Expected End:  04/05/23            Patient will be able to sleep throughout the night with minimal interruption due to pain, stiffness in the left shoulder  after 6 weeks of PT.       Start:  02/22/23    Expected End:  04/05/23               PT SHORT TERM GOAL       Improve tenderness along the L proximal biceps, deltoid       Start:  02/22/23    Expected End:  03/08/23            Improve left shoulder strength by 1/2 muscle grade       Start:  02/22/23    Expected End:  03/08/23                   Plan   Plan  Plan  PT Frequency: 1-2 x/week  Duration: 6 weeks  Treatment Plan: Therapeutic Exercise (97110), Therapeutic Activity (97530), Manual Techniques (97140), Neuromuscular Reeducation [phone]), Postural Education, Estate manager/land agent, Patient/Family Education, Home Exercise Program, Hot/Cold Pack, Statistician, Utrasound (69629)    Therapy Time  PT Evaluation Time Entry  PT Evaluation (Low) Time Entry: 20  PT Therapeutic Procedures Time Entry  Therapeutic Exercise Time Entry: 10    Gaye Pollack, PT  [place] lic # [id number]

## 2023-02-22 NOTE — Plan of Care (Signed)
Outpatient Physical Therapy Plan of Care    Patient Name: Taylor Christensen  MRN: [id number]  DOB: 1946-03-16  Initial Eval Date:  02/22/2023  M25.512, G89.29 Chronic left shoulder pain    Assessment: Patient presents to PT today with signs/sx consistent with a left rotator cuff pathology.  Key impairments include decreased ROM in all planes, reduced strength of the deltoid, rotator cuff and scapulothoracic stabilizers, decreased motor control and timing of shoulder musculature and pain when using the UE for daily and functional movements.  Skilled PT required to address these impairments.  Provided a HEP to work on limiting above impairments and encouraged not to perform any exercises > 6/10 on 0-10 pain scale.       Assessment  Rehab Potential: Good    Goals:  Active       PT LONG TERM GOAL       Independent with HEP       Start:  02/22/23    Expected End:  04/05/23            Patient will demonstrate increased shoulder flexion AROM to >150 degrees within 6 weeks in order to improve patient's ability to perform overhead ADL's.        Start:  02/22/23    Expected End:  04/05/23            Patient will be able to sleep throughout the night with minimal interruption due to pain, stiffness in the left shoulder after 6 weeks of PT.       Start:  02/22/23    Expected End:  04/05/23               PT SHORT TERM GOAL       Improve tenderness along the L proximal biceps, deltoid       Start:  02/22/23    Expected End:  03/08/23            Improve left shoulder strength by 1/2 muscle grade       Start:  02/22/23    Expected End:  03/08/23                  Plan  Plan  PT Frequency: 1-2 x/week  Duration: 6 weeks  Treatment Plan: Therapeutic Exercise (97110), Therapeutic Activity (97530), Manual Techniques (97140), Neuromuscular Reeducation [phone]), Postural Education, Estate manager/land agent, Patient/Family Education, Home Exercise Program, Hot/Cold Pack, Statistician, Utrasound (40102)    Gaye Pollack, PT  [place] lic # [id number]

## 2023-02-28 ENCOUNTER — Encounter
Admit: 2023-02-28 | Discharge: 2023-02-28 | Payer: MEDICARE | Attending: Rehabilitative and Restorative Service Providers" | Primary: Family Medicine

## 2023-02-28 DIAGNOSIS — G8929 Other chronic pain: Secondary | ICD-10-CM

## 2023-02-28 NOTE — Progress Notes (Signed)
Physical Therapy Visit Treatment Note    Patient Name: Taylor Christensen  MRN: [id number]  DOB: 12/14/1945  Today's Date: 02/28/2023  Initial Eval Date:  02/22/2023  Referral Diagnosis:  M25.512, G89.29  Chronic left shoulder pain    Subjective   Subjective: Pt reports "I feel great" during the day but at night she relates high pain.  She denies interrupted sleep from shoulder pain.    Objective   Objective:      OP PT Treatment - Mon February 28, 2023       Row Name 0845       Pain Assessment    Pain Assessment 0-10    Pain Score 0 - No pain  0-8/10 at night       Objective Measurements    Palpation/Inspection TTP @ L SA space, prox biceps; 2/3 tone- left UT, post RTC       Therapeutic Exercise    Exercise AAROM Flex 2x10 5"    Exercise SDL ER 2#  3x10    Exercise SA Punch 2# 3x10    Exercise B Ws OTB 3x10    Exercise B Ts OTB 3x10    Exercise Rows x10    Exercise Extension x10    Exercise doorway stretch, arms low 2x30"       Manual Therapy    Manual Therapy gentle cfm- left prox biceps                    Assessment   Assessment: Pt tolerated first follow-up treatment well.  Initiated program for improved left shoulder function.  Pt relays min ttp along proximal anterior deltoid/bicep tendon.     Plan   Plan: Continue per plan.    Therapy Time  PT Therapeutic Procedures Time Entry  Manual Therapy Time Entry: 5  Therapeutic Exercise Time Entry: 30    Ellen Henri, PTA  [facility] lic # [id number]

## 2023-03-07 ENCOUNTER — Encounter
Admit: 2023-03-07 | Discharge: 2023-03-07 | Payer: MEDICARE | Attending: Rehabilitative and Restorative Service Providers" | Primary: Family Medicine

## 2023-03-07 DIAGNOSIS — G8929 Other chronic pain: Secondary | ICD-10-CM

## 2023-03-07 NOTE — Progress Notes (Signed)
Physical Therapy Visit Treatment Note    Patient Name: Taylor Christensen  MRN: [id number]  DOB: 03-15-1946  Today's Date: 03/07/2023  Initial Eval Date:  02/22/2023  Referral Diagnosis:  M25.512, G89.29  Chronic left shoulder pain    Subjective   Subjective: "It feels good in the day and it just tightens up at night."  Pt reports that tightness is along bilat shoulders.      Objective   Objective:      OP PT Treatment - Mon March 07, 2023       Row Name 0900       Pain Assessment    Pain Assessment 0-10    Pain Score 0 - No pain  0-6/10 at night       Objective Measurements    Palpation/Inspection TTP @ L SA space, prox biceps; 2/3 tone- left UT, post RTC       Therapeutic Exercise    Exercise AAROM Flex 2x10 5"    Exercise SDL ER 0#  3x10    Exercise SA Punch 2# 3x10    Exercise B Ws OTB 3x10    Exercise B Ts OTB 3x10    Exercise RowsOTS  3x10    Exercise Extension PTS 3x10    Exercise doorway stretch, arms low 2x30"    Exercise L crossover strc 2x30"       Manual Therapy    Manual Therapy gentle cfm- left prox biceps    Manual Therapy PROM - left shoulder - all planes                    Assessment   Assessment: Pt tolerated treatment well, relaying appropriate fatigue during exercise.  Able to increase volume for Row and Ext exercises for improved left shoulder function.  Educated pt on crossover stretch for improved self-management of shoulder tightness at night.      Plan   Plan: Continue per plan.    Therapy Time  PT Therapeutic Procedures Time Entry  Manual Therapy Time Entry: 8  Therapeutic Exercise Time Entry: 32    Ellen Henri, PTA  [place] lic # [id number]

## 2023-03-14 ENCOUNTER — Encounter: Payer: MEDICARE | Attending: Rehabilitative and Restorative Service Providers" | Primary: Family Medicine

## 2023-03-21 ENCOUNTER — Encounter
Admit: 2023-03-21 | Discharge: 2023-03-21 | Payer: MEDICARE | Attending: Rehabilitative and Restorative Service Providers" | Primary: Family Medicine

## 2023-03-21 DIAGNOSIS — G8929 Other chronic pain: Secondary | ICD-10-CM

## 2023-03-21 NOTE — Progress Notes (Signed)
Physical Therapy Progress Note - Re-Evaluation    Patient Name: Taylor Christensen  MRN: [id number]  DOB: [birth year]  Initial Eval Date:  02/22/2023  Re-Evaluation Date: 03/21/2023  Referral Diagnosis: M25.512, G89.29 Chronic left shoulder pain    Subjective     General Visit Information:  Background Information  Orders/Protocol: left shoulder pain  Time of Symptoms: Chronic  Diagnostic Tests: X-ray    Current Functional Status  Self Care: Improved  Reaching: Improved  Lifting: Improved  Transitional Mobility: Improved  Sleeping: Improved  Comments: ADL's- min limition    Subjective: "I feel like I am improving.  I feel like the motion has improved and the pain isn't as severe.  I am doing my stretches daily and going a little heavier at the gym."    Pain  Pain Assessment  Pain Assessment: 0-10  Pain Score: 0 - No pain  Pain Location: Shoulder  Pain Orientation: Left        Objective     Posture/Observation  Posture/Observation  Standing Posture: Forward Head, Forward Shoulders    Range of Motion  Overall RUE AROM: Impaired  Overall LUE AROM: Impaired   OP PT General Evaluation       Row Name 03/21/23 1145 02/22/23 0900       AROM Right Upper Extremity    Overall RUE AROM Impaired Impaired    R Shoulder Flexion  0-170 145 145    R Shoulder Extension  0-60 45 45    R Shoulder ABduction 0-160/180 150 150    R Shoulder Internal Rotation  0-70 L1 L1    R Shoulder External Rotation  0-90 C7 C7    R Shoulder Horizontal ABduction 0-40 20 20    R Shoulder Horizontal  ADduction 0-130 100 100       AROM Left Upper Extremity    Overall LUE AROM Impaired Impaired    L Shoulder Flexion  0-170 145 145    L Shoulder Extension  0-60 45 45    L Shoulder ABduction 0-160/180 145 130    L Shoulder Internal Rotation  0-70 T10 T10    L Shoulder External Rotation  0-90 C7 C7    L Shoulder Horizontal ABduction 0-40 20 20    L Shoulder Horizontal ADduction 0-130 90 80                    Strength   OP PT General Evaluation       Row Name 03/21/23  1145 02/22/23 0900       Strength Right Upper Extremity    RUE Overall Strength Impaired Impaired    R Shoulder Flexion 4+/5 4+/5    R Shoulder Extension 5/5 5/5    R Shoulder ABduction 4/5 4/5    R Shoulder Internal Rotation 5/5 5/5    R Shoulder External Rotation 4+/5 4+/5       Strength Left Upper Extremity    LUE Overall Strength Impaired Impaired    L Shoulder Flexion 4/5 4-/5    L Shoulder Extension 4/5 4/5    L Shoulder ABduction 4/5 3+/5    L Shoulder Internal Rotation 4/5 4/5    L Shoulder External Rotation 4-/5 3+/5                    Palpation  Palpation/Inspection  Palpation/Inspection: TTP @ L SA space, prox biceps; 2/3 tone- left UT, post RTC    Neuro-Vascular  Neuro Screen  Sensation Clearing:  (  Intermittent tingling in the left deltoid, brachium)    Examination  Shoulder Examination  Leanord Asal: Left - Positive  Neer Impingement: Left - Positive  Empty Can : Left - Positive  Drop Arm: Left - Negative    Outcome Measures     OP PT General Evaluation       Row Name 03/21/23 1145 02/22/23 0900       Functional Outcome Measures    Additional Tests QuickDASH QuickDASH       QuickDASH    Open a Tight or New Jar 2 2    Do Heavy Household Chores (e.g. Wash Walls, Astronomer) 2 4    Carrying a Ecologist 2 2    Wash Your Back 2 2    Use a Knife to [employer] 2 1    Recreational activities (Golf, Hammering, Tennis) 2 2    During the past week, to what extent has your arm, shoulder, or hand problem interfered with your normal social activities with family, friends, neighbors or groups? 2 3    During the past week, were you limited in your work or other regular daily activities as a result of your arm, shoulder or hand problem? 2 3    Arm, Shoulder or Hand pain 1 3    Tingling (Pins and needles) in your arm, shoulder or hand) 1 2    During the past week, how much difficulty have you had sleeping because of the pain in your arm, shoulder or hand? 2 3    QuickDash Score (0=No disability, 100= Severe  disablity) 20.45 36.36                    Treatment   OP PT Treatment - Mon March 21, 2023       Row Name 1145       Pain Assessment    Pain Assessment 0-10    Pain Score 0 - No pain    Pain Location Shoulder    Pain Orientation Left       Objective Measurements    Palpation/Inspection TTP @ L SA space, prox biceps; 2/3 tone- left UT, post RTC       Therapeutic Exercise    Exercise AAROM Flex 2x10 5"    Exercise SDL ER 0#  3x10    Exercise SA Punch 2# 3x10    Exercise B Ws OTB 3x10    Exercise B Ts OTB 3x10    Exercise RowsOTS  3x10    Exercise Extension PTS 3x10    Exercise doorway stretch, arms low 2x30"    Exercise L crossover strc 2x30"       Manual Therapy    Manual Therapy gentle cfm- left prox biceps    Manual Therapy PROM - left shoulder - all planes                    Assessment   Assessment: Patient presents to PT today with signs/sx consistent with a left rotator cuff pathology.  She has done well with PT.  She will continue with a HEP and follow up as needed in the future.  She is happy with this plan.    Assessment  Rehab Potential: Good    Goals:  Resolved       Independent with HEP (Met)       Start:  02/22/23    Expected End:  04/05/23    Resolved:  03/21/23  Patient will demonstrate increased shoulder flexion AROM to >150 degrees within 6 weeks in order to improve patient's ability to perform overhead ADL's.  (Met)       Start:  02/22/23    Expected End:  04/05/23    Resolved:  03/21/23         Patient will be able to sleep throughout the night with minimal interruption due to pain, stiffness in the left shoulder after 6 weeks of PT. (Met)       Start:  02/22/23    Expected End:  04/05/23    Resolved:  03/21/23         Improve tenderness along the L proximal biceps, deltoid (Met)       Start:  02/22/23    Expected End:  03/08/23    Resolved:  03/21/23         Improve left shoulder strength by 1/2 muscle grade (Met)       Start:  02/22/23    Expected End:  03/08/23    Resolved:  03/21/23         PT LONG TERM GOAL       Independent with HEP (Met)       Start:  02/22/23    Expected End:  04/05/23    Resolved:  03/21/23         Patient will demonstrate increased shoulder flexion AROM to >150 degrees within 6 weeks in order to improve patient's ability to perform overhead ADL's.  (Met)       Start:  02/22/23    Expected End:  04/05/23    Resolved:  03/21/23         Patient will be able to sleep throughout the night with minimal interruption due to pain, stiffness in the left shoulder after 6 weeks of PT. (Met)       Start:  02/22/23    Expected End:  04/05/23    Resolved:  03/21/23        PT SHORT TERM GOAL       Improve tenderness along the L proximal biceps, deltoid (Met)       Start:  02/22/23    Expected End:  03/08/23    Resolved:  03/21/23         Improve left shoulder strength by 1/2 muscle grade (Met)       Start:  02/22/23    Expected End:  03/08/23    Resolved:  03/21/23             Plan   Plan     Plan  PT Frequency: Discharge    Therapy Time  PT Therapeutic Procedures Time Entry  Therapeutic Exercise Time Entry: 30    Gaye Pollack, PT  [place] lic # [id number]

## 2023-03-21 NOTE — Plan of Care (Signed)
Outpatient Physical Therapy Plan of Care    Patient Name: Taylor Christensen  MRN: [id number]  DOB: 1945-10-30  Initial Eval Date:  02/22/2023  M25.512, G89.29 Chronic left shoulder pain    Assessment: Patient presents to PT today with signs/sx consistent with a left rotator cuff pathology.  She has done well with PT.  She will continue with a HEP and follow up as needed in the future.  She is happy with this plan.    Assessment  Rehab Potential: Good    Goals:  Resolved       Independent with HEP (Met)       Start:  02/22/23    Expected End:  04/05/23    Resolved:  03/21/23         Patient will demonstrate increased shoulder flexion AROM to >150 degrees within 6 weeks in order to improve patient's ability to perform overhead ADL's.  (Met)       Start:  02/22/23    Expected End:  04/05/23    Resolved:  03/21/23         Patient will be able to sleep throughout the night with minimal interruption due to pain, stiffness in the left shoulder after 6 weeks of PT. (Met)       Start:  02/22/23    Expected End:  04/05/23    Resolved:  03/21/23         Improve tenderness along the L proximal biceps, deltoid (Met)       Start:  02/22/23    Expected End:  03/08/23    Resolved:  03/21/23         Improve left shoulder strength by 1/2 muscle grade (Met)       Start:  02/22/23    Expected End:  03/08/23    Resolved:  03/21/23        PT LONG TERM GOAL       Independent with HEP (Met)       Start:  02/22/23    Expected End:  04/05/23    Resolved:  03/21/23         Patient will demonstrate increased shoulder flexion AROM to >150 degrees within 6 weeks in order to improve patient's ability to perform overhead ADL's.  (Met)       Start:  02/22/23    Expected End:  04/05/23    Resolved:  03/21/23         Patient will be able to sleep throughout the night with minimal interruption due to pain, stiffness in the left shoulder after 6 weeks of PT. (Met)       Start:  02/22/23    Expected End:  04/05/23    Resolved:  03/21/23        PT SHORT TERM  GOAL       Improve tenderness along the L proximal biceps, deltoid (Met)       Start:  02/22/23    Expected End:  03/08/23    Resolved:  03/21/23         Improve left shoulder strength by 1/2 muscle grade (Met)       Start:  02/22/23    Expected End:  03/08/23    Resolved:  03/21/23            Plan  Plan  PT Frequency: Discharge    Gaye Pollack, PT  [place] lic # [id number]
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
## 2023-07-28 ENCOUNTER — Ambulatory Visit: Payer: Medicare HMO | Attending: Cardiovascular Disease | Admitting: Cardiovascular Disease

## 2023-07-28 ENCOUNTER — Encounter: Payer: Self-pay | Admitting: Cardiovascular Disease

## 2023-07-28 VITALS — BP 118/67 | HR 66 | Ht 69.0 in | Wt 264.2 lb

## 2023-07-28 DIAGNOSIS — I5032 Chronic diastolic (congestive) heart failure: Secondary | ICD-10-CM

## 2023-07-28 DIAGNOSIS — I10 Essential (primary) hypertension: Secondary | ICD-10-CM | POA: Diagnosis not present

## 2023-07-28 NOTE — Patient Instructions (Signed)
 Medication Instructions:  Your Physician recommend you continue on your current medication as directed.    *If you need a refill on your cardiac medications before your next appointment, please call your pharmacy*  Lab Work: No labs ordered today  If you have labs (blood work) drawn today and your tests are completely normal, you will receive your results only by: MyChart Message (if you have MyChart) OR A paper copy in the mail If you have any lab test that is abnormal or we need to change your treatment, we will call you to review the results.  Testing/Procedures: No test ordered today   Follow-Up: At [facility], you and your health needs are our priority.  As part of our continuing mission to provide you with exceptional heart care, our providers are all part of one team.  This team includes your primary Cardiologist (physician) and Advanced Practice Providers or APPs (Physician Assistants and Nurse Practitioners) who all work together to provide you with the care you need, when you need it.  Your next appointment:   1 year(s)  Provider:   Sammy Crisp, MD    We recommend signing up for the patient portal called "MyChart".  Sign up information is provided on this After Visit Summary.  MyChart is used to connect with patients for Virtual Visits (Telemedicine).  Patients are able to view lab/test results, encounter notes, upcoming appointments, etc.  Non-urgent messages can be sent to your provider as well.   To learn more about what you can do with MyChart, go to [email or web address].

## 2023-07-28 NOTE — Progress Notes (Signed)
 Cardiology Office Note   Date:  07/28/2023   ID:  Holly Townsend, Holly Townsend [birth year], MRN [id number]  PCP:  [facility]  Cardiologist:   Holly Kirks, MD   No chief complaint on file.      History of Present Illness: Holly Townsend is a 78 y.o. female who is here today for follow-up visit regarding chronic diastolic heart failure with pulmonary hypertension. She has known history of paroxysmal atrial fibrillation, sleep apnea, obesity, stage III chronic kidney disease, essential hypertension and previous tobacco use. She was diagnosed with atrial fibrillation in 2013 in the setting of decongestants use.  She converted spontaneously to sinus rhythm.  No recurrent atrial fibrillation since then and thus she has not been anticoagulated due to low burden.  CT scan of the lungs for cancer screening in 2019 showed no evidence of coronary atherosclerosis.  However, it did show evidence of cardiomegaly and enlarged pulmonary artery.  There was evidence of emphysema on CT scan. Echocardiogram in March 2020 showed normal LV systolic function with moderate to severe pulmonary hypertension with estimated systolic pulmonary pressure of 63 mmHg.  There was mild to moderate tricuspid regurgitation.  Lexiscan  Myoview  showed no evidence of ischemia.  She has chronic chest pain related to GERD that has not changed.  There is mild lower extremity edema.   Most recent echocardiogram in October 2023 showed an EF of 55 to 60%, grade 1 diastolic dysfunction, mild mitral regurgitation and indeterminate pulmonary pressure.  She has been doing remarkably well with no chest pain or worsening dyspnea.  She has stable lower extremity edema.  She has chronic kidney disease but fortunately this has been stable over the last 5 years with GFR around 25.    Past Medical History:  Diagnosis Date   (HFpEF) heart failure with preserved ejection fraction (HCC)    Allergic rhinitis     CKD (chronic kidney disease) stage 3, GFR 30-59 ml/min (HCC)    Colon polyps    COPD (chronic obstructive pulmonary disease) (HCC)    Hypertension    Osteoarthritis    right knee   Paroxysmal atrial fibrillation (HCC)    Pulmonary fibrosis (HCC)     Past Surgical History:  Procedure Laterality Date   BREAST EXCISIONAL BIOPSY Left 1965   NEG   CATARACT EXTRACTION, BILATERAL     COLONOSCOPY WITH PROPOFOL  N/A 05/30/2019   Procedure: COLONOSCOPY WITH PROPOFOL ;  Surgeon: Holly Townsend, Holly Jeans, MD;  Location: [facility] ENDOSCOPY;  Service: Gastroenterology;  Laterality: N/A;   EYE SURGERY  2017   detached retina   VAGINAL HYSTERECTOMY       Current Outpatient Medications  Medication Sig Dispense Refill   Acetaminophen (TYLENOL ARTHRITIS PAIN PO) Take by mouth 2 (two) times daily.      albuterol (VENTOLIN HFA) 108 (90 Base) MCG/ACT inhaler Inhale 1-2 puffs into the lungs as needed.      aspirin 81 MG tablet Take 81 mg by mouth daily.     cetirizine (ZYRTEC) 10 MG tablet Take 10 mg by mouth daily.     Cholecalciferol (VITAMIN D PO) Take 1,000 Int'l Units by mouth daily.      Cyanocobalamin (VITAMIN B12 PO) Take 1 tablet by mouth daily.     esomeprazole (NEXIUM) 40 MG capsule Take 40 mg by mouth daily.     fluticasone (FLONASE) 50 MCG/ACT nasal spray Place 1 spray into the nose as needed.      furosemide  (LASIX ) 20 MG  tablet Take 20 mg by mouth daily.     HYDROcodone-Chlorpheniramine 5-4 MG/5ML SOLN Take 5 mLs by mouth at bedtime as needed.     losartan (COZAAR) 100 MG tablet Take 100 mg by mouth daily.      metolazone  (ZAROXOLYN ) 2.5 MG tablet Take 2.5 mg by mouth 2 (two) times a week. Monday and Friday.     metoprolol succinate (TOPROL-XL) 100 MG 24 hr tablet Take 100 mg by mouth daily.      omeprazole (PRILOSEC) 20 MG capsule Take 20 mg by mouth daily.     potassium chloride  SA (K-DUR) 20 MEQ tablet Take 1 tablet (20 mEq total) by mouth daily. 90 tablet 3   SPIRIVA HANDIHALER 18 MCG  inhalation capsule 1 capsule daily.     No current facility-administered medications for this visit.    Allergies:   Penicillin g, Penicillins, Statins, Pravastatin sodium, and Sulfamethoxazole-trimethoprim    Social History:  The patient  reports that she quit smoking about 16 years ago. Her smoking use included cigarettes. She started smoking about 63 years ago. She has a 70.5 pack-year smoking history. She has never used smokeless tobacco. She reports that she does not drink alcohol and does not use drugs.   Family History:  The patient's family history includes Kidney failure in her father; Liver cancer in her mother.    ROS:  Please see the history of present illness.   Otherwise, review of systems are positive for none.   All other systems are reviewed and negative.    PHYSICAL EXAM: VS:  BP 118/67 (BP Location: Left Arm, Patient Position: Sitting, Cuff Size: Large)   Pulse 66   Ht 5\' 9"  (1.753 m)   Wt 264 lb 3.2 oz (119.8 kg)   SpO2 93%   BMI 39.02 kg/m  , BMI Body mass index is 39.02 kg/m. GEN: Well nourished, well developed, in no acute distress  HEENT: normal  Neck: Jugular venous pressure is not well visualized. No carotid bruits, or masses Cardiac: RRR; no murmurs, rubs, or gallops, mild  bilateral leg edema Respiratory:  clear to auscultation bilaterally, normal work of breathing GI: soft, nontender, nondistended, + BS MS: no deformity or atrophy  Skin: warm and dry, no rash Neuro:  Strength and sensation are intact Psych: euthymic mood, full affect   EKG:  EKG is ordered today. The ekg ordered today demonstrates : Sinus rhythm Low voltage QRS When compared with ECG of 31-Jan-2012 12:13, No significant change was found      Recent Labs: No results found for requested labs within last 365 days.    Lipid Panel No results found for: "CHOL", "TRIG", "HDL", "CHOLHDL", "VLDL", "LDLCALC", "LDLDIRECT"    Wt Readings from Last 3 Encounters:  07/28/23 264 lb  3.2 oz (119.8 kg)  05/27/22 285 lb 9.6 oz (129.5 kg)  11/26/21 290 lb 4 oz (131.7 kg)          05/18/2018    9:30 AM  PAD Screen  Previous PAD dx? No  Previous surgical procedure? No  Pain with walking? No  Feet/toe relief with dangling? No  Painful, non-healing ulcers? No  Extremities discolored? No      ASSESSMENT AND PLAN:  1.  Chronic diastolic heart failure with moderate pulmonary hypertension: She seems to have stable shortness of breath and appears to be euvolemic on current dose of furosemide  and metolazone .  Given stability of symptoms and renal function over the last 5 years, I elected not to  make any changes.  1 consideration is to add an SGLT 2 inhibitor if she starts having change in her clinical status.  3.  Essential hypertension: Blood pressure is well controlled on current medications  3.  Chronic kidney disease: Followed by [facility] nephrology.  4.  Previous episode of atrial fibrillation in the setting of decongestant use with no recurrent episodes.  No need for anticoagulation unless she develops recurrent episodes.  5.  GERD: Symptoms are stable with PPI.    Disposition:   FU with me in 12 months  Signed,  Holly Kirks, MD  07/28/2023 9:23 AM    [facility]

## 2023-10-28 ENCOUNTER — Other Ambulatory Visit: Payer: Self-pay | Admitting: Family Medicine

## 2023-10-28 DIAGNOSIS — Z1231 Encounter for screening mammogram for malignant neoplasm of breast: Secondary | ICD-10-CM

## 2023-12-07 ENCOUNTER — Ambulatory Visit
Admission: RE | Admit: 2023-12-07 | Discharge: 2023-12-07 | Disposition: A | Discharge status: Home / Self Care | Source: Ambulatory Visit | Attending: Family Medicine | Admitting: Family Medicine

## 2023-12-07 DIAGNOSIS — Z1231 Encounter for screening mammogram for malignant neoplasm of breast: Secondary | ICD-10-CM | POA: Insufficient documentation

## 2024-02-06 ENCOUNTER — Other Ambulatory Visit: Payer: Self-pay | Admitting: Specialist

## 2024-02-06 DIAGNOSIS — R918 Other nonspecific abnormal finding of lung field: Secondary | ICD-10-CM

## 2024-02-07 ENCOUNTER — Ambulatory Visit
Admission: RE | Admit: 2024-02-07 | Discharge: 2024-02-07 | Disposition: A | Discharge status: Home / Self Care | Source: Ambulatory Visit | Attending: Specialist | Admitting: Specialist

## 2024-02-07 DIAGNOSIS — R918 Other nonspecific abnormal finding of lung field: Secondary | ICD-10-CM | POA: Diagnosis present

## 2024-02-23 ENCOUNTER — Other Ambulatory Visit: Payer: Self-pay | Admitting: Neurology

## 2024-02-23 DIAGNOSIS — H02402 Unspecified ptosis of left eyelid: Secondary | ICD-10-CM

## 2024-02-23 DIAGNOSIS — R29810 Facial weakness: Secondary | ICD-10-CM

## 2024-03-01 ENCOUNTER — Ambulatory Visit: Admission: RE | Admit: 2024-03-01 | Discharge: 2024-03-01 | Attending: Neurology | Admitting: Neurology

## 2024-03-01 DIAGNOSIS — H02402 Unspecified ptosis of left eyelid: Secondary | ICD-10-CM | POA: Diagnosis present

## 2024-03-01 DIAGNOSIS — R29810 Facial weakness: Secondary | ICD-10-CM | POA: Insufficient documentation
